# Patient Record
Sex: Male | Born: 1955
Health system: Southern US, Community
[De-identification: ages and names within clinical notes are randomized; demographics above are authoritative.]

## PROBLEM LIST (undated history)

## (undated) DIAGNOSIS — E785 Hyperlipidemia, unspecified: Secondary | ICD-10-CM

## (undated) DIAGNOSIS — K635 Polyp of colon: Secondary | ICD-10-CM

## (undated) DIAGNOSIS — I10 Essential (primary) hypertension: Secondary | ICD-10-CM

## (undated) DIAGNOSIS — E119 Type 2 diabetes mellitus without complications: Secondary | ICD-10-CM

## (undated) DIAGNOSIS — G4730 Sleep apnea, unspecified: Secondary | ICD-10-CM

## (undated) DIAGNOSIS — C801 Malignant (primary) neoplasm, unspecified: Secondary | ICD-10-CM

## (undated) DIAGNOSIS — F419 Anxiety disorder, unspecified: Secondary | ICD-10-CM

## (undated) DIAGNOSIS — F329 Major depressive disorder, single episode, unspecified: Secondary | ICD-10-CM

## (undated) DIAGNOSIS — K219 Gastro-esophageal reflux disease without esophagitis: Secondary | ICD-10-CM

## (undated) DIAGNOSIS — R32 Unspecified urinary incontinence: Secondary | ICD-10-CM

## (undated) DIAGNOSIS — F32A Depression, unspecified: Secondary | ICD-10-CM

## (undated) HISTORY — DX: Gastro-esophageal reflux disease without esophagitis: K21.9

## (undated) HISTORY — DX: Polyp of colon: K63.5

## (undated) HISTORY — DX: Major depressive disorder, single episode, unspecified: F32.9

## (undated) HISTORY — DX: Hyperlipidemia, unspecified: E78.5

## (undated) HISTORY — DX: Anxiety disorder, unspecified: F41.9

## (undated) HISTORY — DX: Type 2 diabetes mellitus without complications: E11.9

## (undated) HISTORY — DX: Depression, unspecified: F32.A

## (undated) HISTORY — DX: Essential (primary) hypertension: I10

## (undated) HISTORY — DX: Malignant (primary) neoplasm, unspecified: C80.1

## (undated) HISTORY — PX: COLONOSCOPY: SHX174

## (undated) HISTORY — DX: Sleep apnea, unspecified: G47.30

## (undated) HISTORY — DX: Unspecified urinary incontinence: R32

---

## 2010-09-25 HISTORY — PX: APPENDECTOMY: SHX54

## 2010-09-25 HISTORY — PX: HEMICOLECTOMY: SHX854

## 2015-09-26 HISTORY — PX: PROSTATECTOMY: SHX69

## 2018-07-03 ENCOUNTER — Encounter: Payer: Self-pay | Admitting: Family Medicine

## 2018-07-03 ENCOUNTER — Ambulatory Visit: Payer: Self-pay | Admitting: Family Medicine

## 2018-07-03 VITALS — BP 122/86 | HR 85 | Temp 98.2°F | Ht 73.0 in | Wt 231.4 lb

## 2018-07-03 DIAGNOSIS — Z1159 Encounter for screening for other viral diseases: Secondary | ICD-10-CM

## 2018-07-03 DIAGNOSIS — Z8546 Personal history of malignant neoplasm of prostate: Secondary | ICD-10-CM

## 2018-07-03 DIAGNOSIS — Z23 Encounter for immunization: Secondary | ICD-10-CM | POA: Diagnosis not present

## 2018-07-03 DIAGNOSIS — I10 Essential (primary) hypertension: Secondary | ICD-10-CM | POA: Diagnosis not present

## 2018-07-03 DIAGNOSIS — E785 Hyperlipidemia, unspecified: Secondary | ICD-10-CM | POA: Insufficient documentation

## 2018-07-03 DIAGNOSIS — Z9989 Dependence on other enabling machines and devices: Secondary | ICD-10-CM

## 2018-07-03 DIAGNOSIS — R5383 Other fatigue: Secondary | ICD-10-CM

## 2018-07-03 DIAGNOSIS — Z0000 Encounter for general adult medical examination without abnormal findings: Secondary | ICD-10-CM | POA: Diagnosis not present

## 2018-07-03 DIAGNOSIS — Z1211 Encounter for screening for malignant neoplasm of colon: Secondary | ICD-10-CM

## 2018-07-03 DIAGNOSIS — I2510 Atherosclerotic heart disease of native coronary artery without angina pectoris: Secondary | ICD-10-CM | POA: Insufficient documentation

## 2018-07-03 DIAGNOSIS — E782 Mixed hyperlipidemia: Secondary | ICD-10-CM

## 2018-07-03 DIAGNOSIS — F339 Major depressive disorder, recurrent, unspecified: Secondary | ICD-10-CM

## 2018-07-03 DIAGNOSIS — R32 Unspecified urinary incontinence: Secondary | ICD-10-CM | POA: Insufficient documentation

## 2018-07-03 DIAGNOSIS — N393 Stress incontinence (female) (male): Secondary | ICD-10-CM

## 2018-07-03 DIAGNOSIS — E119 Type 2 diabetes mellitus without complications: Secondary | ICD-10-CM

## 2018-07-03 DIAGNOSIS — G4733 Obstructive sleep apnea (adult) (pediatric): Secondary | ICD-10-CM

## 2018-07-03 LAB — MICROALBUMIN / CREATININE URINE RATIO
Creatinine,U: 201.8 mg/dL
MICROALB UR: 1.9 mg/dL (ref 0.0–1.9)
Microalb Creat Ratio: 0.9 mg/g (ref 0.0–30.0)

## 2018-07-03 LAB — CBC WITH DIFFERENTIAL/PLATELET
BASOS PCT: 0.6 % (ref 0.0–3.0)
Basophils Absolute: 0 10*3/uL (ref 0.0–0.1)
EOS PCT: 2 % (ref 0.0–5.0)
Eosinophils Absolute: 0.1 10*3/uL (ref 0.0–0.7)
HCT: 39.2 % (ref 39.0–52.0)
HEMOGLOBIN: 13.1 g/dL (ref 13.0–17.0)
Lymphocytes Relative: 18.3 % (ref 12.0–46.0)
Lymphs Abs: 1 10*3/uL (ref 0.7–4.0)
MCHC: 33.5 g/dL (ref 30.0–36.0)
MCV: 86.7 fl (ref 78.0–100.0)
MONO ABS: 0.3 10*3/uL (ref 0.1–1.0)
Monocytes Relative: 6 % (ref 3.0–12.0)
Neutro Abs: 3.9 10*3/uL (ref 1.4–7.7)
Neutrophils Relative %: 73.1 % (ref 43.0–77.0)
Platelets: 368 10*3/uL (ref 150.0–400.0)
RBC: 4.52 Mil/uL (ref 4.22–5.81)
RDW: 13.5 % (ref 11.5–15.5)
WBC: 5.3 10*3/uL (ref 4.0–10.5)

## 2018-07-03 LAB — LIPID PANEL
CHOLESTEROL: 148 mg/dL (ref 0–200)
HDL: 42 mg/dL (ref >39.00)
NonHDL: 105.67
Total CHOL/HDL Ratio: 4
Triglycerides: 262 mg/dL — ABNORMAL HIGH (ref 0.0–149.0)
VLDL: 52.4 mg/dL — ABNORMAL HIGH (ref 0.0–40.0)

## 2018-07-03 LAB — COMPREHENSIVE METABOLIC PANEL
ALBUMIN: 4.4 g/dL (ref 3.5–5.2)
ALK PHOS: 95 U/L (ref 39–117)
ALT: 15 U/L (ref 0–53)
AST: 15 U/L (ref 0–37)
BUN: 15 mg/dL (ref 6–23)
CHLORIDE: 98 meq/L (ref 96–112)
CO2: 34 mEq/L — ABNORMAL HIGH (ref 19–32)
Calcium: 9.9 mg/dL (ref 8.4–10.5)
Creatinine, Ser: 1.03 mg/dL (ref 0.40–1.50)
GFR: 77.45 mL/min (ref >60.00)
Glucose, Bld: 133 mg/dL — ABNORMAL HIGH (ref 70–99)
POTASSIUM: 4.2 meq/L (ref 3.5–5.1)
SODIUM: 138 meq/L (ref 135–145)
TOTAL PROTEIN: 7.4 g/dL (ref 6.0–8.3)
Total Bilirubin: 0.6 mg/dL (ref 0.2–1.2)

## 2018-07-03 LAB — TSH: TSH: 1.45 u[IU]/mL (ref 0.35–4.50)

## 2018-07-03 LAB — HEMOGLOBIN A1C: HEMOGLOBIN A1C: 6.7 % — AB (ref 4.6–6.5)

## 2018-07-03 LAB — LDL CHOLESTEROL, DIRECT: Direct LDL: 74 mg/dL

## 2018-07-03 LAB — VITAMIN D 25 HYDROXY (VIT D DEFICIENCY, FRACTURES): VITD: 31.3 ng/mL (ref 30.00–100.00)

## 2018-07-03 MED ORDER — NEFAZODONE HCL 100 MG PO TABS: 100.0000 mg | ORAL_TABLET | Freq: Two times a day (BID) | ORAL | 1 refills | Status: DC

## 2018-07-03 MED ORDER — CARVEDILOL 3.125 MG PO TABS: 3.1250 mg | ORAL_TABLET | Freq: Two times a day (BID) | ORAL | 3 refills | Status: DC

## 2018-07-03 MED ORDER — LISINOPRIL-HYDROCHLOROTHIAZIDE 20-12.5 MG PO TABS: 2.0000 | ORAL_TABLET | Freq: Every day | ORAL | 3 refills | Status: DC

## 2018-07-03 MED ORDER — OXYBUTYNIN CHLORIDE ER 5 MG PO TB24: 5.0000 mg | ORAL_TABLET | Freq: Every day | ORAL | 1 refills | Status: DC

## 2018-07-03 MED ORDER — ATORVASTATIN CALCIUM 80 MG PO TABS: 80.0000 mg | ORAL_TABLET | Freq: Every day | ORAL | 3 refills | Status: DC

## 2018-07-03 MED ORDER — METFORMIN HCL ER 500 MG PO TB24: 1000.0000 mg | ORAL_TABLET | Freq: Two times a day (BID) | ORAL | 1 refills | Status: DC

## 2018-07-03 MED ORDER — BUPROPION HCL ER (SR) 200 MG PO TB12: 200.0000 mg | ORAL_TABLET | Freq: Two times a day (BID) | ORAL | 1 refills | Status: DC

## 2018-07-03 MED ORDER — OMEPRAZOLE 20 MG PO CPDR: 20.0000 mg | DELAYED_RELEASE_CAPSULE | Freq: Every day | ORAL | 3 refills | Status: DC

## 2018-07-03 NOTE — Progress Notes (Signed)
Patient: Theodore Foley MRN: [id number] DOB: [birth year] PCP: No primary care provider on file.     Subjective:  Chief Complaint  Patient presents with  . Establish Care    HPI: The patient is a 62 y.o. male who presents today for annual exam. He denies any changes to past medical history. There have been no recent hospitalizations. They are following a well balanced diet and exercise plan. Weight has been stable. No complaints today.   Hypertension: Here for follow up of hypertension.  Currently on lisinopril-hctz and coreg . Takes medication as prescribed and denies any side effects. Exercise includes walking. Weight has been stable. Denies any chest pain, headaches, shortness of breath, vision changes, swelling in lower extremities.   ?CAD-saw [facility] for chest pain. On asa/ng/coreg. nuc stress in 01/2018 was negative.   Diabetes: Patient is here for follow up of type 2 diabete.  Currently on the following medications farxiga, metformin and amaryl. Takes medications as prescribed. Last A1C was 6.6 in July. Currently exercising and following diabetic diet. Denies any hypoglycemic events. Denies any vision changes, nausea, vomiting, abdominal pain, ulcers/paraesthesia in feet, polyuria, polydipsia or polyphagia. Denies any chest pain, shortness of breath. Wants to stop his farxiga due to cost.   Depression:  Was diagnosed on 1992. Has been on current medication for a while and states it works great for him. Has been on medication for this time and been on everything until they found this combination and it works well for him. No hx of si/hi/ah/vh. No struggles with anxiety.   Urinary incontinence: states after his prostate removal has struggled with incontinence. Is currently on myrbetriq, but can not afford this and would like a cheaper alternative.   Immunization History  Administered Date(s) Administered  . Influenza Split 06/20/2010  . Influenza, Seasonal, Injecte, Preservative Fre  06/23/2011, 06/21/2012  . Influenza,inj,Quad PF,6+ Mos 07/03/2018  . Influenza-Unspecified 05/25/2017  . Tdap 06/23/2011, 07/03/2018     Colonoscopy: 2013. Hx of adenomas. 2012 had hemi-colectomy. Over due for next scope.  PSA: hx of prostate cancer with removal in 05/2016. Needs referral for urology here.  Tdap: today  Flu: today  Pneumovax: has had this done. 04/2017  Review of Systems  Constitutional: Positive for fatigue. Negative for chills and fever.  HENT: Negative for dental problem, ear pain, hearing loss and trouble swallowing.   Eyes: Negative for visual disturbance.  Respiratory: Negative for cough, chest tightness and shortness of breath.   Cardiovascular: Negative for chest pain, palpitations and leg swelling.  Gastrointestinal: Positive for nausea. Negative for abdominal pain, blood in stool and diarrhea.       Gi upset w/Metformin  Endocrine: Negative for cold intolerance, polydipsia, polyphagia and polyuria.  Genitourinary: Negative for dysuria and hematuria.  Musculoskeletal: Positive for back pain. Negative for arthralgias and neck pain.       Upper back, left shoulder stiffness  Skin: Negative.  Negative for rash.  Neurological: Negative for dizziness and headaches.  Psychiatric/Behavioral: Positive for sleep disturbance. Negative for dysphoric mood. The patient is not nervous/anxious.     Allergies Patient has no allergies on file.  Past Medical History Patient  has a past medical history of Cancer [facility]), Colon polyps, Depression, Diabetes mellitus without complication ([place]), GERD (gastroesophageal reflux disease), Hyperlipidemia, Hypertension, and Urine incontinence.  Surgical History Patient  has a past surgical history that includes Prostatectomy (2017); Hemicolectomy (Right, 2012); and Appendectomy (2012).  Family History Pateint's family history includes Alcohol abuse in his sister; Cancer in  his brother and maternal grandfather; Depression in his  brother, daughter, daughter, daughter, father, paternal grandmother, son, and son; Diabetes in his brother; Early death in his brother; Hearing loss in his brother; Heart attack in his brother, mother, and paternal grandfather; Heart disease in his brother, maternal grandmother, mother, and paternal grandfather; Hyperlipidemia in his brother and father; Hypertension in his brother, brother, father, and maternal grandmother; Miscarriages / [place] in his mother.  Social History Patient  reports that he has quit smoking. He has never used smokeless tobacco.    Objective: Vitals:   07/03/18 1021  BP: 122/86  Pulse: 85  Temp: 98.2 F (36.8 C)  TempSrc: Oral  SpO2: 97%  Weight: 231 lb 6.4 oz (105 kg)  Height: 6\' 1"  (1.854 m)    Body mass index is 30.53 kg/m.  Physical Exam  Constitutional: He is oriented to person, place, and time. He appears well-developed and well-nourished.  HENT:  Right Ear: External ear normal.  Left Ear: External ear normal.  Mouth/Throat: Oropharynx is clear and moist. No oropharyngeal exudate.  Hearing aides bilaterally. TM pearly with light reflex on left and cerumen impacted on right.   Eyes: Pupils are equal, round, and reactive to light. Conjunctivae and EOM are normal.  Neck: Normal range of motion. Neck supple. No thyromegaly present.  Cardiovascular: Normal rate, regular rhythm, normal heart sounds and intact distal pulses.  No murmur heard. Pulmonary/Chest: Effort normal and breath sounds normal.  Abdominal: Soft. Bowel sounds are normal. He exhibits no distension. There is no tenderness.  Lymphadenopathy:    He has no cervical adenopathy.  Neurological: He is alert and oriented to person, place, and time. He displays normal reflexes. No cranial nerve deficit. Coordination normal.  Skin: Skin is warm and dry. No rash noted.  Psychiatric: He has a normal mood and affect. His behavior is normal.  Vitals reviewed.        Office Visit from  07/03/2018 in [place]  PHQ-9 Total Score  9      Assessment/plan: 1. Annual physical exam Routine lab work today. Overdue for cscope which he needs q 3-5 years with his history. Referral done for this. Tdap/flu shot today. utd on pneumovax per patient, but requesting records to make sure as I do not see in care everywhere.  Exercise/diabetic diet recommended. F/u in one year for routine annual.  Patient counseling [x]    Nutrition: Stressed importance of moderation in sodium/caffeine intake, saturated fat and cholesterol, caloric balance, sufficient intake of fresh fruits, vegetables, fiber, calcium, iron, and 1 mg of folate supplement per day (for females capable of pregnancy).  [x]    Stressed the importance of regular exercise.   []    Substance Abuse: Discussed cessation/primary prevention of tobacco, alcohol, or other drug use; driving or other dangerous activities under the influence; availability of treatment for abuse.   [x]    Injury prevention: Discussed safety belts, safety helmets, smoke detector, smoking near bedding or upholstery.   [x]    Sexuality: Discussed sexually transmitted diseases, partner selection, use of condoms, avoidance of unintended pregnancy  and contraceptive alternatives.  [x]    Dental health: Discussed importance of regular tooth brushing, flossing, and dental visits.  [x]    Health maintenance and immunizations reviewed. Please refer to Health maintenance section.    - TSH  2. Need for prophylactic vaccination with combined diphtheria-tetanus-pertussis (DTP) vaccine  - Tdap vaccine greater than or equal to 7yo IM  3. Essential hypertension Blood pressure is to goal.  Continue current anti-hypertensive medications. Refills given and routine lab work will be done today. Recommended routine exercise and healthy diet including DASH diet and mediterranean diet. Encouraged weight loss. F/u in 6 months.   - Comprehensive metabolic panel -  CBC with Differential/Platelet - Lipid panel  4. Mixed hyperlipidemia -rechecking today after cardiology increased his stating to 80mg . Also checking liver fx. He is fastin today.   5. Diabetes mellitus without complication (HCC) Last T9Q was to goal at 6.6. He can no longer afford farxiga and likely makes his incontinence worse. Checking a1c today. Refilled his metformin. im not a fan of amaryl and asked if he would be willing to try weekly injectable. May have him check with insurance and see if any of the other sglt2 drugs are covered by his insurance, otherwise I think I will stop the amaryl and see how he does on just metformin and lower dose of the GLP-1 drugs. Continue diabetic diet. Discussed yearly eye exams. Will do leep exam next visit. Looking into records to see if up to date on his pneumovax. F/u in 3-6 months depending on lab work. Regardless will need labs in 3 months with med changes. Hypoglycemic precautions given.  - Hemoglobin A1c - Microalbumin / creatinine urine ratio  6. Encounter for screening colonoscopy  - Ambulatory referral to Gastroenterology  7. Encounter for hepatitis C screening test for low risk patient  - Hepatitis C antibody  8. History of prostate cancer -needs referral to see urologist here. Will have them draw the psa. He will talk over with daughter and then let me know who they would like to be referred to.   9. Depression, recurrent ([place]) Well controlled on his current medication and phq-9 score indicative of this with mild depression. Refilled both of his drugs. F/u in 6-12 months.   10. Stress incontinence of urine Can not afford the myrbetriq. We will stop this and do trial on antimuscarinic. Discussed these drugs have a lot more side effects and evidence has showed that possible link to increased dementia. Falls, dizziness, dry eyes, etc. Also may increase effects of ssri. Discussed he can follow up with urology on new drug and they can manage.  Let me know if any issues.    11. OSA on CPAP Non compliant. Discussed risks of untreated osa. Weight loss also recommended.   12. Need for prophylactic vaccination and inoculation against influenza  - Flu Vaccine QUAD 6+ mos PF IM (Fluarix Quad PF)  13. Other fatigue  - VITAMIN D 25 Hydroxy (Vit-D Deficiency, Fractures)  -will review records from care everywhere. >30 minutes spent in managing/treating multiple co-morbidities outside of annual exam.   Return in about 6 months (around 01/02/2019) for diabetic check up .     Orma Flaming, MD [place]  07/03/2018

## 2018-07-04 ENCOUNTER — Telehealth: Payer: Self-pay | Admitting: Family Medicine

## 2018-07-04 LAB — HEPATITIS C ANTIBODY
Hepatitis C Ab: NONREACTIVE
SIGNAL TO CUT-OFF: 0.03 (ref <1.00)

## 2018-07-04 NOTE — Telephone Encounter (Signed)
See note  Copied from [place] [id number]. Topic: General - Other >> Jul 04, 2018  3:16 PM Ivar Drape wrote: Reason for CRM:  Patient stated Dr. Rogers Blocker left a message on his voicemail to give her a call.  He returned the call, but the office could not be reached. Please call again.

## 2018-07-04 NOTE — Telephone Encounter (Signed)
Dr. Rogers Blocker spoke with patient and reviewed all lab results.  He verbalized understanding.

## 2018-08-05 ENCOUNTER — Encounter: Payer: Self-pay | Admitting: Family Medicine

## 2018-08-14 ENCOUNTER — Telehealth: Payer: Self-pay | Admitting: Gastroenterology

## 2018-09-24 ENCOUNTER — Telehealth: Payer: Self-pay | Admitting: Radiology

## 2018-09-24 NOTE — Telephone Encounter (Signed)
Please place future lab orders for patients lab appt on 10/03/2018. Thanks!

## 2018-10-02 ENCOUNTER — Other Ambulatory Visit: Payer: Self-pay

## 2018-10-02 DIAGNOSIS — E119 Type 2 diabetes mellitus without complications: Secondary | ICD-10-CM

## 2018-10-02 NOTE — Telephone Encounter (Signed)
Future lab orders placed for cmp and a1c.  Pt has lab appt on 1/9.

## 2018-10-03 ENCOUNTER — Other Ambulatory Visit (INDEPENDENT_AMBULATORY_CARE_PROVIDER_SITE_OTHER): Payer: BLUE CROSS/BLUE SHIELD

## 2018-10-03 DIAGNOSIS — E119 Type 2 diabetes mellitus without complications: Secondary | ICD-10-CM

## 2018-10-03 LAB — COMPREHENSIVE METABOLIC PANEL WITH GFR
ALT: 15 U/L (ref 0–53)
AST: 12 U/L (ref 0–37)
Albumin: 4.4 g/dL (ref 3.5–5.2)
Alkaline Phosphatase: 89 U/L (ref 39–117)
BUN: 19 mg/dL (ref 6–23)
CO2: 29 meq/L (ref 19–32)
Calcium: 9.8 mg/dL (ref 8.4–10.5)
Chloride: 97 meq/L (ref 96–112)
Creatinine, Ser: 1.22 mg/dL (ref 0.40–1.50)
GFR: 63.65 mL/min
Glucose, Bld: 178 mg/dL — ABNORMAL HIGH (ref 70–99)
Potassium: 4.7 meq/L (ref 3.5–5.1)
Sodium: 137 meq/L (ref 135–145)
Total Bilirubin: 0.4 mg/dL (ref 0.2–1.2)
Total Protein: 7.1 g/dL (ref 6.0–8.3)

## 2018-10-03 LAB — HEMOGLOBIN A1C: Hgb A1c MFr Bld: 7.4 % — ABNORMAL HIGH (ref 4.6–6.5)

## 2018-10-09 ENCOUNTER — Ambulatory Visit: Payer: BLUE CROSS/BLUE SHIELD | Admitting: Family Medicine

## 2018-10-09 ENCOUNTER — Encounter: Payer: Self-pay | Admitting: Family Medicine

## 2018-10-09 VITALS — BP 98/68 | HR 84 | Temp 97.7°F | Ht 73.0 in | Wt 233.4 lb

## 2018-10-09 DIAGNOSIS — E782 Mixed hyperlipidemia: Secondary | ICD-10-CM

## 2018-10-09 DIAGNOSIS — E119 Type 2 diabetes mellitus without complications: Secondary | ICD-10-CM | POA: Diagnosis not present

## 2018-10-09 DIAGNOSIS — I10 Essential (primary) hypertension: Secondary | ICD-10-CM

## 2018-10-09 DIAGNOSIS — Z114 Encounter for screening for human immunodeficiency virus [HIV]: Secondary | ICD-10-CM

## 2018-10-09 DIAGNOSIS — Z23 Encounter for immunization: Secondary | ICD-10-CM | POA: Diagnosis not present

## 2018-10-09 MED ORDER — OMEPRAZOLE 20 MG PO CPDR: 20.0000 mg | DELAYED_RELEASE_CAPSULE | Freq: Every day | ORAL | 3 refills | Status: DC

## 2018-10-09 MED ORDER — METFORMIN HCL ER 500 MG PO TB24: 1000.0000 mg | ORAL_TABLET | Freq: Two times a day (BID) | ORAL | 1 refills | Status: DC

## 2018-10-09 MED ORDER — BUPROPION HCL ER (SR) 200 MG PO TB12: 200.0000 mg | ORAL_TABLET | Freq: Two times a day (BID) | ORAL | 1 refills | Status: DC

## 2018-10-09 MED ORDER — ATORVASTATIN CALCIUM 80 MG PO TABS: 80.0000 mg | ORAL_TABLET | Freq: Every day | ORAL | 3 refills | Status: DC

## 2018-10-09 MED ORDER — OXYBUTYNIN CHLORIDE ER 5 MG PO TB24: 5.0000 mg | ORAL_TABLET | Freq: Every day | ORAL | 3 refills | Status: DC

## 2018-10-09 MED ORDER — NEFAZODONE HCL 100 MG PO TABS: 100.0000 mg | ORAL_TABLET | Freq: Two times a day (BID) | ORAL | 1 refills | Status: DC

## 2018-10-09 MED ORDER — EMPAGLIFLOZIN 25 MG PO TABS: 25.0000 mg | ORAL_TABLET | Freq: Every day | ORAL | 3 refills | Status: DC

## 2018-10-09 MED ORDER — LISINOPRIL-HYDROCHLOROTHIAZIDE 20-12.5 MG PO TABS: 2.0000 | ORAL_TABLET | Freq: Every day | ORAL | 3 refills | Status: DC

## 2018-10-09 NOTE — Patient Instructions (Addendum)
[facility].[place] Kitchen Dr. Fuller Plan was who was looking at your OEVOJ [phone]    For diabetes.. increasing your jardiance to 25mg  and you are going to work on diet. See how you do and check labs in 3 months. I ordered these for you. Will see if you are to goal and if so we can push appointment to 6 months.    Stopping coreg. Consult your cardiologist about taper off of this, but would drop down to once/day for a week or so and then 1/2 tablet or stop.

## 2018-10-09 NOTE — Progress Notes (Addendum)
Patient: Theodore Foley MRN: [id number] DOB: [birth year] PCP: Orma Flaming, MD     Subjective:  Chief Complaint  Patient presents with  . Diabetes    HPI: The patient is a 63 y.o. male who presents today for diabetes follow up. His A1c was just checked and it was 7.4. When I saw him 3 months ago we refilled his metformin, stopped his amaryl (did not like it and neither do I) and farxiga due to cost. We started him on jardiance and he is doing fine with this. He is only on the 10mg  dose. He also states it was the holiday and his diet was poor. Renal function was normal. He is otherwise well. Denies any hypoglycemic events. Denies any vision changes, nausea, vomiting, abdominal pain, ulcers/paraesthesia in feet, polyuria, polydipsia or polyphagia. Denies any chest pain, shortness of breath. He is not exercising like he should and really wants to get back into using his treadmill.   Colonoscopy: due for this and I referred him 3 months ago when I saw him. I got a note saying they were unable to get a hold of patient; however, there is a note that says Dr Fuller Plan received records and would review; however, patient has never heard back.   He is also wondering about stopping his beta blocker. Has no hx of CAD/CHF.   Review of Systems  Constitutional: Negative for fatigue.  Eyes: Negative for visual disturbance.  Respiratory: Negative for shortness of breath.   Cardiovascular: Negative for chest pain.  Gastrointestinal: Positive for abdominal pain and diarrhea. Negative for nausea.       C/o diarrhea and stomach cramping w/Metformin  Endocrine: Negative for polydipsia, polyphagia and polyuria.  Genitourinary: Negative for frequency and urgency.  Musculoskeletal: Positive for back pain. Negative for neck pain.       C/o chronic low back pain  Skin: Negative.   Neurological: Negative for dizziness and headaches.  Psychiatric/Behavioral: Positive for sleep disturbance. The patient is not  nervous/anxious.     Allergies Patient has no allergies on file.  Past Medical History Patient  has a past medical history of Cancer [facility]), Colon polyps, Depression, Diabetes mellitus without complication ([place]), GERD (gastroesophageal reflux disease), Hyperlipidemia, Hypertension, and Urine incontinence.  Surgical History Patient  has a past surgical history that includes Prostatectomy (2017); Hemicolectomy (Right, 2012); and Appendectomy (2012).  Family History Pateint's family history includes Alcohol abuse in his sister; Cancer in his brother and maternal grandfather; Depression in his brother, daughter, daughter, daughter, father, paternal grandmother, son, and son; Diabetes in his brother; Early death in his brother; Hearing loss in his brother; Heart attack in his brother, mother, and paternal grandfather; Heart disease in his brother, maternal grandmother, mother, and paternal grandfather; Hyperlipidemia in his brother and father; Hypertension in his brother, brother, father, and maternal grandmother; Miscarriages / [place] in his mother.  Social History Patient  reports that he has quit smoking. He has never used smokeless tobacco.    Objective: Vitals:   10/09/18 0942  BP: 98/68  Pulse: 84  Temp: 97.7 F (36.5 C)  TempSrc: Oral  SpO2: 96%  Weight: 233 lb 6.4 oz (105.9 kg)  Height: 6\' 1"  (1.854 m)    Body mass index is 30.79 kg/m.  Physical Exam Vitals signs reviewed.  Constitutional:      Appearance: He is obese.  HENT:     Right Ear: Tympanic membrane, ear canal and external ear normal.     Left Ear: Tympanic membrane, ear canal  and external ear normal.     Nose: Nose normal.  Neck:     Musculoskeletal: Normal range of motion and neck supple.  Cardiovascular:     Rate and Rhythm: Normal rate and regular rhythm.     Heart sounds: Normal heart sounds.  Pulmonary:     Effort: Pulmonary effort is normal.     Breath sounds: Normal breath sounds.  Abdominal:      General: Abdomen is flat. Bowel sounds are normal.     Palpations: Abdomen is soft.  Neurological:     General: No focal deficit present.     Mental Status: He is alert and oriented to person, place, and time.    Diabetic foot exam done today and normal. See chart.      Assessment/plan: 1. Need for shingles vaccine  - Varicella-zoster vaccine IM (Shingrix)  2. Diabetes mellitus without complication ([place]) Above goal with a1c of 7.4 he brought his drug list and we could do ozempic. He does want to try and increase his jardiance and really work on diet and exercise. i'll give him 3 months of diet/exercise, continue metformin and increase his jardiance to 25mg . If not to goal in 3 months we will add on ozempic. He is agreeable to this plan as he would like to stay off any injectable if he can.  - Hemoglobin A1c; Future  3. Essential hypertension Will be due for labs in 3 months, future ordered.  - CBC with Differential/Platelet; Future - Comprehensive metabolic panel; Future  4. Mixed hyperlipidemia  - Lipid panel; Future  5. Encounter for screening for HIV - HIV Antibody (routine testing w rflx); Future  -number given for [facility] to see if he needs to schedule after Dr. Fuller Plan reviewed records.   -will stop beta blocker. No indication for this as he has no heart disease/chf and his blood pressure is to goal. Wean discussed. Daughter is cardiologist as well.   Return in about 3 months (around 01/08/2019) for labs only and will see if need to be seen .   Orma Flaming, MD [place]   10/09/2018

## 2018-10-10 ENCOUNTER — Encounter: Payer: Self-pay | Admitting: Family Medicine

## 2018-10-10 ENCOUNTER — Other Ambulatory Visit: Payer: Self-pay | Admitting: Family Medicine

## 2018-10-10 ENCOUNTER — Encounter: Payer: Self-pay | Admitting: Gastroenterology

## 2018-10-10 DIAGNOSIS — Z8546 Personal history of malignant neoplasm of prostate: Secondary | ICD-10-CM

## 2018-10-30 ENCOUNTER — Ambulatory Visit (AMBULATORY_SURGERY_CENTER): Payer: Self-pay | Admitting: *Deleted

## 2018-10-30 ENCOUNTER — Other Ambulatory Visit: Payer: Self-pay

## 2018-10-30 VITALS — Ht 73.0 in | Wt 232.4 lb

## 2018-10-30 DIAGNOSIS — Z86010 Personal history of colonic polyps: Secondary | ICD-10-CM

## 2018-10-30 MED ORDER — SUPREP BOWEL PREP KIT 17.5-3.13-1.6 GM/177ML PO SOLN: 1.0000 | Freq: Once | ORAL | 0 refills | Status: AC

## 2018-10-30 NOTE — Progress Notes (Signed)
No egg or soy allergy known to patient  No issues with past sedation with any surgeries  or procedures, no intubation problems  No diet pills per patient No home 02 use per patient  No blood thinners per patient  Pt denies issues with constipation  No A fib or A flutter  EMMI video sent to pt's e mail  suprep 15.00 coupon given 

## 2018-11-06 ENCOUNTER — Encounter: Payer: Self-pay | Admitting: Gastroenterology

## 2018-11-20 ENCOUNTER — Encounter: Payer: Self-pay | Admitting: Gastroenterology

## 2018-11-20 ENCOUNTER — Ambulatory Visit (AMBULATORY_SURGERY_CENTER): Payer: BLUE CROSS/BLUE SHIELD | Admitting: Gastroenterology

## 2018-11-20 VITALS — BP 99/57 | HR 76 | Temp 96.8°F | Resp 17 | Ht 73.0 in | Wt 233.0 lb

## 2018-11-20 DIAGNOSIS — Z86010 Personal history of colonic polyps: Secondary | ICD-10-CM

## 2018-11-20 DIAGNOSIS — Z1211 Encounter for screening for malignant neoplasm of colon: Secondary | ICD-10-CM | POA: Diagnosis not present

## 2018-11-20 DIAGNOSIS — D123 Benign neoplasm of transverse colon: Secondary | ICD-10-CM

## 2018-11-20 DIAGNOSIS — D125 Benign neoplasm of sigmoid colon: Secondary | ICD-10-CM | POA: Diagnosis not present

## 2018-11-20 MED ORDER — SODIUM CHLORIDE 0.9 % IV SOLN: 500.0000 mL | Freq: Once | INTRAVENOUS | Status: DC

## 2018-11-20 NOTE — Op Note (Signed)
[facility] Patient Name: Theodore Foley Procedure Date: 11/20/2018 11:01 AM MRN: [id number] Endoscopist: Ladene Artist , MD Age: 63 Referring MD:  Date of Birth: [birth year] Gender: Male Account #: [id number] Procedure:                Colonoscopy Indications:              Surveillance: Personal history of adenomatous                            polyps on last colonoscopy > 5 years ago Medicines:                Monitored Anesthesia Care Procedure:                Pre-Anesthesia Assessment:                           - Prior to the procedure, a History and Physical                            was performed, and patient medications and                            allergies were reviewed. The patient's tolerance of                            previous anesthesia was also reviewed. The risks                            and benefits of the procedure and the sedation                            options and risks were discussed with the patient.                            All questions were answered, and informed consent                            was obtained. Prior Anticoagulants: The patient has                            taken no previous anticoagulant or antiplatelet                            agents. ASA Grade Assessment: II - A patient with                            mild systemic disease. After reviewing the risks                            and benefits, the patient was deemed in                            satisfactory condition to undergo the procedure.  After obtaining informed consent, the colonoscope                            was passed under direct vision. Throughout the                            procedure, the patient's blood pressure, pulse, and                            oxygen saturations were monitored continuously. The                            Colonoscope was introduced through the anus and                            advanced to the the  ileocolonic anastomosis. The                            quality of the bowel preparation was adequate after                            lavage and suctioning. The colonoscopy was                            performed without difficulty. The patient tolerated                            the procedure well. The terminal ileum and the                            rectum were photographed. Scope In: 11:12:23 AM Scope Out: 11:29:30 AM Scope Withdrawal Time: 0 hours 14 minutes 47 seconds  Total Procedure Duration: 0 hours 17 minutes 7 seconds  Findings:                 The perianal and digital rectal examinations were                            normal.                           There was evidence of a prior end-to-side                            ileo-colonic anastomosis in the ascending colon.                            This was patent and was characterized by healthy                            appearing mucosa. The anastomosis was traversed.                           Two sessile polyps were found in the sigmoid colon  and transverse colon. The polyps were 7 to 9 mm in                            size. These polyps were removed with a cold snare.                            Resection and retrieval were complete.                           Multiple medium-mouthed diverticula were found in                            the left colon. There was no evidence of                            diverticular bleeding.                           Internal hemorrhoids were found during                            retroflexion. The hemorrhoids were medium-sized and                            Grade I (internal hemorrhoids that do not prolapse).                           The exam was otherwise without abnormality on                            direct and retroflexion views. Complications:            No immediate complications. Estimated blood loss:                            None. Estimated Blood  Loss:     Estimated blood loss: none. Impression:               - Patent end-to-side ileo-colonic anastomosis,                            characterized by healthy appearing mucosa.                           - Two 7 to 9 mm polyps in the sigmoid colon and in                            the transverse colon, removed with a cold snare.                            Resected and retrieved.                           - Internal hemorrhoids.                           -  Moderate diverticulosis in the left colon. There                            was no evidence of diverticular bleeding. Recommendation:           - Repeat colonoscopy in 5 years for surveillance.                           - Patient has a contact number available for                            emergencies. The signs and symptoms of potential                            delayed complications were discussed with the                            patient. Return to normal activities tomorrow.                            Written discharge instructions were provided to the                            patient.                           - High fiber diet.                           - Continue present medications.                           - Await pathology results. Ladene Artist, MD 11/20/2018 11:36:09 AM This report has been signed electronically.

## 2018-11-20 NOTE — Patient Instructions (Signed)
Handout on high fiber diet, hemorrhoids, polyps and diverticulosis given   YOU HAD AN ENDOSCOPIC PROCEDURE TODAY AT [facility]:   Refer to the procedure report that was given to you for any specific questions about what was found during the examination.  If the procedure report does not answer your questions, please call your gastroenterologist to clarify.  If you requested that your care partner not be given the details of your procedure findings, then the procedure report has been included in a sealed envelope for you to review at your convenience later.  YOU SHOULD EXPECT: Some feelings of bloating in the abdomen. Passage of more gas than usual.  Walking can help get rid of the air that was put into your GI tract during the procedure and reduce the bloating. If you had a lower endoscopy (such as a colonoscopy or flexible sigmoidoscopy) you may notice spotting of blood in your stool or on the toilet paper. If you underwent a bowel prep for your procedure, you may not have a normal bowel movement for a few days.  Please Note:  You might notice some irritation and congestion in your nose or some drainage.  This is from the oxygen used during your procedure.  There is no need for concern and it should clear up in a day or so.  SYMPTOMS TO REPORT IMMEDIATELY:   Following lower endoscopy (colonoscopy or flexible sigmoidoscopy):  Excessive amounts of blood in the stool  Significant tenderness or worsening of abdominal pains  Swelling of the abdomen that is new, acute  Fever of 100F or higher  For urgent or emergent issues, a gastroenterologist can be reached at any hour by calling [phone].   DIET:  We do recommend a small meal at first, but then you may proceed to your regular diet.  Drink plenty of fluids but you should avoid alcoholic beverages for 24 hours.  ACTIVITY:  You should plan to take it easy for the rest of today and you should NOT DRIVE or use heavy machinery  until tomorrow (because of the sedation medicines used during the test).    FOLLOW UP: Our staff will call the number listed on your records the next business day following your procedure to check on you and address any questions or concerns that you may have regarding the information given to you following your procedure. If we do not reach you, we will leave a message.  However, if you are feeling well and you are not experiencing any problems, there is no need to return our call.  We will assume that you have returned to your regular daily activities without incident.  If any biopsies were taken you will be contacted by phone or by letter within the next 1-3 weeks.  Please call us at [phone] if you have not heard about the biopsies in 3 weeks.    SIGNATURES/CONFIDENTIALITY: You and/or your care partner have signed paperwork which will be entered into your electronic medical record.  These signatures attest to the fact that that the information above on your After Visit Summary has been reviewed and is understood.  Full responsibility of the confidentiality of this discharge information lies with you and/or your care-partner.

## 2018-11-20 NOTE — Progress Notes (Signed)
Pt's states no medical or surgical changes since previsit or office visit. 

## 2018-11-20 NOTE — Progress Notes (Signed)
To PACU, VSS. Report to [email or web address] 

## 2018-11-21 ENCOUNTER — Telehealth: Payer: Self-pay | Admitting: *Deleted

## 2018-11-21 NOTE — Telephone Encounter (Signed)
  Follow up Call-  Call back number 11/20/2018  Post procedure Call Back phone  # [phone]  Permission to leave phone message Yes  Some recent data might be hidden     Patient questions:  Do you have a fever, pain , or abdominal swelling? No. Pain Score  0 *  Have you tolerated food without any problems? Yes.    Have you been able to return to your normal activities? Yes.    Do you have any questions about your discharge instructions: Diet   No. Medications  No. Follow up visit  No.  Do you have questions or concerns about your Care? No.  Actions: * If pain score is 4 or above: No action needed, pain <4.

## 2018-11-28 ENCOUNTER — Encounter: Payer: Self-pay | Admitting: Gastroenterology

## 2018-12-13 ENCOUNTER — Other Ambulatory Visit: Payer: Self-pay | Admitting: Family Medicine

## 2018-12-13 MED ORDER — OSELTAMIVIR PHOSPHATE 75 MG PO CAPS: 75.0000 mg | ORAL_CAPSULE | Freq: Every day | ORAL | 0 refills | Status: DC

## 2018-12-13 NOTE — Progress Notes (Signed)
Grandson diagnosed with the flu. Symptom onset less than 24 hours. Lives with him. Requesting prophylaxis tamiflu. Sent in for him. No renal adjustment.   Orma Flaming, MD [place]

## 2018-12-26 NOTE — Telephone Encounter (Signed)
Pt has seen Dr. Fuller Plan

## 2019-01-02 ENCOUNTER — Ambulatory Visit: Payer: BLUE CROSS/BLUE SHIELD | Admitting: Family Medicine

## 2019-01-08 ENCOUNTER — Other Ambulatory Visit: Payer: Self-pay

## 2019-01-08 ENCOUNTER — Other Ambulatory Visit (INDEPENDENT_AMBULATORY_CARE_PROVIDER_SITE_OTHER): Payer: BLUE CROSS/BLUE SHIELD

## 2019-01-08 ENCOUNTER — Ambulatory Visit (INDEPENDENT_AMBULATORY_CARE_PROVIDER_SITE_OTHER): Payer: BLUE CROSS/BLUE SHIELD

## 2019-01-08 DIAGNOSIS — I10 Essential (primary) hypertension: Secondary | ICD-10-CM | POA: Diagnosis not present

## 2019-01-08 DIAGNOSIS — E119 Type 2 diabetes mellitus without complications: Secondary | ICD-10-CM

## 2019-01-08 DIAGNOSIS — E782 Mixed hyperlipidemia: Secondary | ICD-10-CM

## 2019-01-08 DIAGNOSIS — Z23 Encounter for immunization: Secondary | ICD-10-CM | POA: Diagnosis not present

## 2019-01-08 DIAGNOSIS — Z8546 Personal history of malignant neoplasm of prostate: Secondary | ICD-10-CM | POA: Diagnosis not present

## 2019-01-08 DIAGNOSIS — Z114 Encounter for screening for human immunodeficiency virus [HIV]: Secondary | ICD-10-CM | POA: Diagnosis not present

## 2019-01-08 LAB — CBC WITH DIFFERENTIAL/PLATELET
Basophils Absolute: 0.1 10*3/uL (ref 0.0–0.1)
Basophils Relative: 0.8 % (ref 0.0–3.0)
Eosinophils Absolute: 0.1 10*3/uL (ref 0.0–0.7)
Eosinophils Relative: 1.7 % (ref 0.0–5.0)
HCT: 39 % (ref 39.0–52.0)
Hemoglobin: 13.3 g/dL (ref 13.0–17.0)
Lymphocytes Relative: 14.5 % (ref 12.0–46.0)
Lymphs Abs: 0.9 10*3/uL (ref 0.7–4.0)
MCHC: 34.2 g/dL (ref 30.0–36.0)
MCV: 86.7 fl (ref 78.0–100.0)
Monocytes Absolute: 0.3 10*3/uL (ref 0.1–1.0)
Monocytes Relative: 5.2 % (ref 3.0–12.0)
Neutro Abs: 4.9 10*3/uL (ref 1.4–7.7)
Neutrophils Relative %: 77.8 % — ABNORMAL HIGH (ref 43.0–77.0)
Platelets: 308 10*3/uL (ref 150.0–400.0)
RBC: 4.49 Mil/uL (ref 4.22–5.81)
RDW: 13.6 % (ref 11.5–15.5)
WBC: 6.2 10*3/uL (ref 4.0–10.5)

## 2019-01-08 LAB — LIPID PANEL
Cholesterol: 106 mg/dL (ref 0–200)
HDL: 34.5 mg/dL — ABNORMAL LOW (ref >39.00)
NonHDL: 71.05
Total CHOL/HDL Ratio: 3
Triglycerides: 225 mg/dL — ABNORMAL HIGH (ref 0.0–149.0)
VLDL: 45 mg/dL — ABNORMAL HIGH (ref 0.0–40.0)

## 2019-01-08 LAB — COMPREHENSIVE METABOLIC PANEL
ALT: 14 U/L (ref 0–53)
AST: 13 U/L (ref 0–37)
Albumin: 4.5 g/dL (ref 3.5–5.2)
Alkaline Phosphatase: 88 U/L (ref 39–117)
BUN: 29 mg/dL — ABNORMAL HIGH (ref 6–23)
CO2: 28 mEq/L (ref 19–32)
Calcium: 9.5 mg/dL (ref 8.4–10.5)
Chloride: 96 mEq/L (ref 96–112)
Creatinine, Ser: 1.41 mg/dL (ref 0.40–1.50)
GFR: 50.63 mL/min — ABNORMAL LOW (ref >60.00)
Glucose, Bld: 125 mg/dL — ABNORMAL HIGH (ref 70–99)
Potassium: 4.4 mEq/L (ref 3.5–5.1)
Sodium: 134 mEq/L — ABNORMAL LOW (ref 135–145)
Total Bilirubin: 0.6 mg/dL (ref 0.2–1.2)
Total Protein: 7.1 g/dL (ref 6.0–8.3)

## 2019-01-08 LAB — PSA: PSA: 0.01 ng/mL — ABNORMAL LOW (ref 0.10–4.00)

## 2019-01-08 LAB — HEMOGLOBIN A1C: Hgb A1c MFr Bld: 7 % — ABNORMAL HIGH (ref 4.6–6.5)

## 2019-01-08 LAB — LDL CHOLESTEROL, DIRECT: Direct LDL: 44 mg/dL

## 2019-01-08 NOTE — Progress Notes (Signed)
Per orders of Dr. Rogers Blocker, injection of Shingrix #2 given left deltoid IM by Kevan Ny, CMA Patient tolerated injection well.

## 2019-01-12 LAB — HIV ANTIBODY (ROUTINE TESTING W REFLEX): HIV 1&2 Ab, 4th Generation: NONREACTIVE

## 2019-01-22 ENCOUNTER — Encounter: Payer: Self-pay | Admitting: Family Medicine

## 2019-01-24 ENCOUNTER — Other Ambulatory Visit: Payer: Self-pay | Admitting: Family Medicine

## 2019-01-24 DIAGNOSIS — R799 Abnormal finding of blood chemistry, unspecified: Secondary | ICD-10-CM

## 2019-01-24 MED ORDER — ROSUVASTATIN CALCIUM 20 MG PO TABS: 20.0000 mg | ORAL_TABLET | Freq: Every day | ORAL | 3 refills | Status: DC

## 2019-02-12 ENCOUNTER — Other Ambulatory Visit (INDEPENDENT_AMBULATORY_CARE_PROVIDER_SITE_OTHER): Payer: BLUE CROSS/BLUE SHIELD

## 2019-02-12 ENCOUNTER — Other Ambulatory Visit: Payer: Self-pay

## 2019-02-12 DIAGNOSIS — R799 Abnormal finding of blood chemistry, unspecified: Secondary | ICD-10-CM

## 2019-02-12 LAB — BASIC METABOLIC PANEL
BUN: 25 mg/dL — ABNORMAL HIGH (ref 6–23)
CO2: 28 mEq/L (ref 19–32)
Calcium: 9.4 mg/dL (ref 8.4–10.5)
Chloride: 97 mEq/L (ref 96–112)
Creatinine, Ser: 1.37 mg/dL (ref 0.40–1.50)
GFR: 52.33 mL/min — ABNORMAL LOW (ref >60.00)
Glucose, Bld: 126 mg/dL — ABNORMAL HIGH (ref 70–99)
Potassium: 4.5 mEq/L (ref 3.5–5.1)
Sodium: 136 mEq/L (ref 135–145)

## 2019-02-18 ENCOUNTER — Telehealth: Payer: Self-pay | Admitting: Family Medicine

## 2019-02-18 NOTE — Telephone Encounter (Signed)
See request °

## 2019-02-18 NOTE — Telephone Encounter (Signed)
Copied from [place] [phone] [phone]. Topic: Quick Communication - Rx Refill/Question >> Feb 18, 2019  3:06 PM Parke Poisson wrote: Medication:nefazodone (SERZONE) 100 MG tablet   Has the patient contacted their pharmacy?Yes  (Agent: If yes, when and what did the pharmacy advise?) No refills  Preferred Pharmacy (with phone number or street name):[id number] [place] [place] [phone] (Phone) [phone] (Fax)    Agent: Please be advised that RX refills may take up to 3 business days. We ask that you follow-up with your pharmacy.

## 2019-02-19 NOTE — Telephone Encounter (Signed)
Spoke to [place] at [employer] at [employer].  There was a hold placed on Serzone rx for some reason.  Advised to please remove hold and ok to fill rx.

## 2019-02-21 ENCOUNTER — Encounter: Payer: Self-pay | Admitting: Family Medicine

## 2019-02-21 ENCOUNTER — Other Ambulatory Visit: Payer: Self-pay

## 2019-02-21 ENCOUNTER — Ambulatory Visit: Payer: BLUE CROSS/BLUE SHIELD | Admitting: Family Medicine

## 2019-02-21 DIAGNOSIS — H6123 Impacted cerumen, bilateral: Secondary | ICD-10-CM

## 2019-02-21 NOTE — Progress Notes (Signed)
Patient here for nurse visit for ear lavage.  He reports that he has noticed increased difficulty hearing and wax buildup in his ears.  Visual inspection of his ears confirms excessive buildup of wax in b/l ears R > L.  Spent close to 30 mins attempting to clean patient's ears.  Both ears lavaged using a mixture of warm water and peroxide.  I also went in with curettage and removed wax in right ear. Not all was able to be removed due to depth and pain.  Orma Flaming, MD [place]

## 2019-03-24 ENCOUNTER — Encounter: Payer: Self-pay | Admitting: Family Medicine

## 2019-03-27 ENCOUNTER — Telehealth: Payer: Self-pay | Admitting: Family Medicine

## 2019-03-27 MED ORDER — MONTELUKAST SODIUM 10 MG PO TABS: 10.0000 mg | ORAL_TABLET | Freq: Every day | ORAL | 3 refills | Status: DC

## 2019-03-27 NOTE — Telephone Encounter (Signed)
See note

## 2019-03-27 NOTE — Telephone Encounter (Signed)
Patient is checking on the status of his Montelukast (generic for Singulair) 10 mg tablet prescription request.  He wants a 90day supply.  The prescription was requested on 03/24/2019. Patient only has two days left.

## 2019-05-08 ENCOUNTER — Other Ambulatory Visit: Payer: Self-pay | Admitting: Family Medicine

## 2019-05-10 DIAGNOSIS — E119 Type 2 diabetes mellitus without complications: Secondary | ICD-10-CM | POA: Diagnosis not present

## 2019-05-19 ENCOUNTER — Other Ambulatory Visit: Payer: Self-pay

## 2019-05-19 ENCOUNTER — Encounter (INDEPENDENT_AMBULATORY_CARE_PROVIDER_SITE_OTHER): Payer: BC Managed Care – PPO | Admitting: Ophthalmology

## 2019-05-19 DIAGNOSIS — H35033 Hypertensive retinopathy, bilateral: Secondary | ICD-10-CM | POA: Diagnosis not present

## 2019-05-19 DIAGNOSIS — H2513 Age-related nuclear cataract, bilateral: Secondary | ICD-10-CM | POA: Diagnosis not present

## 2019-05-19 DIAGNOSIS — H43813 Vitreous degeneration, bilateral: Secondary | ICD-10-CM | POA: Diagnosis not present

## 2019-05-19 DIAGNOSIS — I10 Essential (primary) hypertension: Secondary | ICD-10-CM

## 2019-07-01 ENCOUNTER — Telehealth: Payer: Self-pay | Admitting: Family Medicine

## 2019-07-01 NOTE — Telephone Encounter (Signed)
Patient sent a message stating he had an eye exam on May 19, 2019. No further information was provided.

## 2019-07-01 NOTE — Telephone Encounter (Signed)
Noted and Health maintenance updated in patient's chart.

## 2019-07-11 ENCOUNTER — Encounter: Payer: Self-pay | Admitting: Family Medicine

## 2019-07-11 ENCOUNTER — Other Ambulatory Visit: Payer: Self-pay

## 2019-07-11 ENCOUNTER — Other Ambulatory Visit: Payer: Self-pay | Admitting: Family Medicine

## 2019-07-11 ENCOUNTER — Ambulatory Visit (INDEPENDENT_AMBULATORY_CARE_PROVIDER_SITE_OTHER): Payer: BC Managed Care – PPO | Admitting: Family Medicine

## 2019-07-11 VITALS — BP 112/64 | HR 65 | Temp 97.4°F | Ht 73.0 in | Wt 228.6 lb

## 2019-07-11 DIAGNOSIS — E119 Type 2 diabetes mellitus without complications: Secondary | ICD-10-CM

## 2019-07-11 DIAGNOSIS — H2589 Other age-related cataract: Secondary | ICD-10-CM | POA: Diagnosis not present

## 2019-07-11 DIAGNOSIS — I10 Essential (primary) hypertension: Secondary | ICD-10-CM

## 2019-07-11 DIAGNOSIS — Z23 Encounter for immunization: Secondary | ICD-10-CM | POA: Diagnosis not present

## 2019-07-11 DIAGNOSIS — N179 Acute kidney failure, unspecified: Secondary | ICD-10-CM

## 2019-07-11 DIAGNOSIS — H269 Unspecified cataract: Secondary | ICD-10-CM | POA: Insufficient documentation

## 2019-07-11 LAB — CBC WITH DIFFERENTIAL/PLATELET
Basophils Absolute: 0 10*3/uL (ref 0.0–0.1)
Basophils Relative: 0.9 % (ref 0.0–3.0)
Eosinophils Absolute: 0.1 10*3/uL (ref 0.0–0.7)
Eosinophils Relative: 1.9 % (ref 0.0–5.0)
HCT: 39.9 % (ref 39.0–52.0)
Hemoglobin: 13.7 g/dL (ref 13.0–17.0)
Lymphocytes Relative: 15.2 % (ref 12.0–46.0)
Lymphs Abs: 0.8 10*3/uL (ref 0.7–4.0)
MCHC: 34.2 g/dL (ref 30.0–36.0)
MCV: 89 fl (ref 78.0–100.0)
Monocytes Absolute: 0.4 10*3/uL (ref 0.1–1.0)
Monocytes Relative: 6.6 % (ref 3.0–12.0)
Neutro Abs: 4 10*3/uL (ref 1.4–7.7)
Neutrophils Relative %: 75.4 % (ref 43.0–77.0)
Platelets: 333 10*3/uL (ref 150.0–400.0)
RBC: 4.48 Mil/uL (ref 4.22–5.81)
RDW: 13.5 % (ref 11.5–15.5)
WBC: 5.3 10*3/uL (ref 4.0–10.5)

## 2019-07-11 LAB — COMPREHENSIVE METABOLIC PANEL
ALT: 14 U/L (ref 0–53)
AST: 14 U/L (ref 0–37)
Albumin: 4.6 g/dL (ref 3.5–5.2)
Alkaline Phosphatase: 70 U/L (ref 39–117)
BUN: 28 mg/dL — ABNORMAL HIGH (ref 6–23)
CO2: 30 mEq/L (ref 19–32)
Calcium: 9.7 mg/dL (ref 8.4–10.5)
Chloride: 99 mEq/L (ref 96–112)
Creatinine, Ser: 1.52 mg/dL — ABNORMAL HIGH (ref 0.40–1.50)
GFR: 46.36 mL/min — ABNORMAL LOW (ref >60.00)
Glucose, Bld: 129 mg/dL — ABNORMAL HIGH (ref 70–99)
Potassium: 4.7 mEq/L (ref 3.5–5.1)
Sodium: 137 mEq/L (ref 135–145)
Total Bilirubin: 0.5 mg/dL (ref 0.2–1.2)
Total Protein: 6.9 g/dL (ref 6.0–8.3)

## 2019-07-11 LAB — HEMOGLOBIN A1C: Hgb A1c MFr Bld: 6.4 % (ref 4.6–6.5)

## 2019-07-11 NOTE — Progress Notes (Signed)
Creatinine and bun increased. Stopping jardiance and metformin. Repeat bmp in 2 weeks. Likely secondary to jardiance as his BUN has been mildly elevated since starting.  Orma Flaming, MD [address]

## 2019-07-11 NOTE — Progress Notes (Signed)
Patient: Theodore Foley MRN: [id number] DOB: October 14, 1955 PCP: Orma Flaming, MD     Subjective:  Chief Complaint  Patient presents with  . Hypertension    HPI: The patient is a 63 y.o. male who presents today for HTN and diabetes follow up.   Hypertension: Here for follow up of hypertension.  Currently on prinzide. Takes medication as prescribed and denies any side effects. Exercise includes walking daily, at least one mile. Weight has been stable. Denies any chest pain, headaches, shortness of breath, vision changes, swelling in lower extremities. Has lost 5 pounds since January.   Diabetes: Patient is here for follow up of type 2 diabetes. Currently on the following medications metformin, jardiance . Takes medications as prescribed. Last A1C was 7.0. Currently exercising and following diabetic diet. Sugars range from 90 to 115. Denies any hypoglycemic events. Denies any vision changes, nausea, vomiting, abdominal pain, ulcers/paraesthesia in feet, polyuria, polydipsia or polyphagia. Denies any chest pain, shortness of breath. Lost 5 pounds since January.   Being evaluated for cataract surgery. Should have this done this year.   Review of Systems  Constitutional: Negative for chills, fatigue and fever.  HENT: Positive for congestion. Negative for dental problem, ear pain, hearing loss, postnasal drip, rhinorrhea and trouble swallowing.   Eyes: Positive for visual disturbance.  Respiratory: Negative for cough, chest tightness and shortness of breath.   Cardiovascular: Negative for chest pain, palpitations and leg swelling.  Gastrointestinal: Negative for abdominal pain, blood in stool, diarrhea, nausea and vomiting.  Endocrine: Negative for cold intolerance, polydipsia, polyphagia and polyuria.  Genitourinary: Negative for dysuria and hematuria.  Musculoskeletal: Negative for arthralgias.  Skin: Negative for rash.  Neurological: Negative for dizziness and headaches.   Psychiatric/Behavioral: Positive for sleep disturbance. Negative for dysphoric mood. The patient is not nervous/anxious.     Allergies Patient has No Known Allergies.  Past Medical History Patient  has a past medical history of Anxiety, Cancer ([place]), Colon polyps, Depression, Diabetes mellitus without complication ([place]), GERD (gastroesophageal reflux disease), Hyperlipidemia, Hypertension, Sleep apnea, and Urine incontinence.  Surgical History Patient  has a past surgical history that includes Prostatectomy (2017); Hemicolectomy (Right, 2012); Appendectomy (2012); and Colonoscopy.  Family History Pateint's family history includes Alcohol abuse in his sister; Cancer in his brother and maternal grandfather; Depression in his brother, daughter, daughter, daughter, father, paternal grandmother, son, and son; Diabetes in his brother; Early death in his brother; Hearing loss in his brother; Heart attack in his brother, mother, and paternal grandfather; Heart disease in his brother, maternal grandmother, mother, and paternal grandfather; Hyperlipidemia in his brother and father; Hypertension in his brother, brother, father, and maternal grandmother; Miscarriages / [place] in his mother; Pancreatic cancer in his brother.  Social History Patient  reports that he has quit smoking. He has never used smokeless tobacco. He reports current alcohol use of about 6.0 standard drinks of alcohol per week. He reports that he does not use drugs.    Objective: Vitals:   07/11/19 0801  BP: 112/64  Pulse: 65  Temp: (!) 97.4 F (36.3 C)  TempSrc: Skin  SpO2: 96%  Weight: 228 lb 9.6 oz (103.7 kg)  Height: 6\' 1"  (1.854 m)    Body mass index is 30.16 kg/m.  Physical Exam Vitals signs reviewed.  Constitutional:      Appearance: Normal appearance. He is well-developed.  HENT:     Head: Normocephalic and atraumatic.     Ears:     Comments: Bilateral ha Eyes:  Extraocular Movements: Extraocular  movements intact.     Conjunctiva/sclera: Conjunctivae normal.     Pupils: Pupils are equal, round, and reactive to light.  Neck:     Musculoskeletal: Normal range of motion and neck supple.     Thyroid: No thyromegaly.  Cardiovascular:     Rate and Rhythm: Normal rate and regular rhythm.     Heart sounds: Normal heart sounds. No murmur.  Pulmonary:     Effort: Pulmonary effort is normal.     Breath sounds: Normal breath sounds.  Abdominal:     General: Abdomen is flat. Bowel sounds are normal. There is no distension.     Palpations: Abdomen is soft.     Tenderness: There is no abdominal tenderness.  Lymphadenopathy:     Cervical: No cervical adenopathy.  Skin:    General: Skin is warm and dry.     Findings: No rash.  Neurological:     General: No focal deficit present.     Mental Status: He is alert and oriented to person, place, and time.     Cranial Nerves: No cranial nerve deficit.     Coordination: Coordination normal.     Deep Tendon Reflexes: Reflexes normal.  Psychiatric:        Mood and Affect: Mood normal.        Behavior: Behavior normal.        Depression screen [facility] 2/9 07/11/2019 07/03/2018 07/03/2018  Decreased Interest 0 0 0  Down, Depressed, Hopeless 0 0 0  PHQ - 2 Score 0 0 0  Altered sleeping - 3 -  Tired, decreased energy - 2 -  Change in appetite - 1 -  Feeling bad or failure about yourself  - 1 -  Trouble concentrating - 2 -  Moving slowly or fidgety/restless - 0 -  Suicidal thoughts - 0 -  PHQ-9 Score - 9 -  Difficult doing work/chores - Somewhat difficult -    Assessment/plan: 1. Essential hypertension Blood pressure is to goal. Continue current anti-hypertensive medications. Refills not needed. Routine lab work will be done today. Recommended routine exercise and healthy diet including DASH diet and mediterranean diet. Encouraged weight loss. F/u in 6 months.    2. Diabetes mellitus without complication ([place]) Lost weight, watching diet and  fasting sugars to goal. Checking a1c/renal function today. On statin/aCEI, high dose flu today, eye exam completed and foot exam done. Continue healthy, diabetic diet/exercise and continued weight loss. Doing well at getting in exercise daily. F/u on labs and f/u with me in 3-6 months. Hypoglycemic precautions given.   3. Flu shot-high dose.    Return in about 6 months (around 01/09/2020) for fasting labs/annual .   Orma Flaming, MD [place]   07/11/2019

## 2019-07-17 ENCOUNTER — Encounter: Payer: Self-pay | Admitting: Family Medicine

## 2019-07-28 DIAGNOSIS — E113293 Type 2 diabetes mellitus with mild nonproliferative diabetic retinopathy without macular edema, bilateral: Secondary | ICD-10-CM | POA: Diagnosis not present

## 2019-07-28 DIAGNOSIS — H2511 Age-related nuclear cataract, right eye: Secondary | ICD-10-CM | POA: Diagnosis not present

## 2019-07-28 DIAGNOSIS — H35033 Hypertensive retinopathy, bilateral: Secondary | ICD-10-CM | POA: Diagnosis not present

## 2019-07-28 DIAGNOSIS — H52213 Irregular astigmatism, bilateral: Secondary | ICD-10-CM | POA: Diagnosis not present

## 2019-07-28 DIAGNOSIS — H2513 Age-related nuclear cataract, bilateral: Secondary | ICD-10-CM | POA: Diagnosis not present

## 2019-07-28 DIAGNOSIS — H25013 Cortical age-related cataract, bilateral: Secondary | ICD-10-CM | POA: Diagnosis not present

## 2019-07-29 ENCOUNTER — Other Ambulatory Visit (INDEPENDENT_AMBULATORY_CARE_PROVIDER_SITE_OTHER): Payer: BC Managed Care – PPO

## 2019-07-29 ENCOUNTER — Other Ambulatory Visit: Payer: Self-pay

## 2019-07-29 DIAGNOSIS — N179 Acute kidney failure, unspecified: Secondary | ICD-10-CM | POA: Diagnosis not present

## 2019-07-29 LAB — BASIC METABOLIC PANEL
BUN: 19 mg/dL (ref 6–23)
CO2: 30 mEq/L (ref 19–32)
Calcium: 9.4 mg/dL (ref 8.4–10.5)
Chloride: 99 mEq/L (ref 96–112)
Creatinine, Ser: 1.23 mg/dL (ref 0.40–1.50)
GFR: 59.17 mL/min — ABNORMAL LOW (ref >60.00)
Glucose, Bld: 158 mg/dL — ABNORMAL HIGH (ref 70–99)
Potassium: 4.3 mEq/L (ref 3.5–5.1)
Sodium: 137 mEq/L (ref 135–145)

## 2019-07-30 ENCOUNTER — Encounter: Payer: Self-pay | Admitting: Family Medicine

## 2019-07-31 ENCOUNTER — Encounter: Payer: Self-pay | Admitting: Family Medicine

## 2019-08-01 ENCOUNTER — Encounter: Payer: Self-pay | Admitting: Family Medicine

## 2019-08-01 ENCOUNTER — Other Ambulatory Visit: Payer: Self-pay | Admitting: Family Medicine

## 2019-08-01 MED ORDER — TRULICITY 0.75 MG/0.5ML ~~LOC~~ SOAJ: 0.7500 mg | SUBCUTANEOUS | 0 refills | Status: DC

## 2019-08-12 DIAGNOSIS — H52211 Irregular astigmatism, right eye: Secondary | ICD-10-CM | POA: Diagnosis not present

## 2019-08-12 DIAGNOSIS — H2511 Age-related nuclear cataract, right eye: Secondary | ICD-10-CM | POA: Diagnosis not present

## 2019-08-12 DIAGNOSIS — H25811 Combined forms of age-related cataract, right eye: Secondary | ICD-10-CM | POA: Diagnosis not present

## 2019-08-26 ENCOUNTER — Encounter: Payer: Self-pay | Admitting: Family Medicine

## 2019-08-27 ENCOUNTER — Other Ambulatory Visit: Payer: Self-pay

## 2019-08-27 MED ORDER — TRULICITY 1.5 MG/0.5ML ~~LOC~~ SOAJ: 1.5000 mg | SUBCUTANEOUS | 3 refills | Status: DC

## 2019-09-03 DIAGNOSIS — H2512 Age-related nuclear cataract, left eye: Secondary | ICD-10-CM | POA: Diagnosis not present

## 2019-09-03 DIAGNOSIS — H25012 Cortical age-related cataract, left eye: Secondary | ICD-10-CM | POA: Diagnosis not present

## 2019-09-09 DIAGNOSIS — H25012 Cortical age-related cataract, left eye: Secondary | ICD-10-CM | POA: Diagnosis not present

## 2019-09-09 DIAGNOSIS — H2512 Age-related nuclear cataract, left eye: Secondary | ICD-10-CM | POA: Diagnosis not present

## 2019-09-09 DIAGNOSIS — H25812 Combined forms of age-related cataract, left eye: Secondary | ICD-10-CM | POA: Diagnosis not present

## 2019-09-09 DIAGNOSIS — H52212 Irregular astigmatism, left eye: Secondary | ICD-10-CM | POA: Diagnosis not present

## 2019-09-23 ENCOUNTER — Encounter: Payer: Self-pay | Admitting: Family Medicine

## 2019-09-24 ENCOUNTER — Other Ambulatory Visit: Payer: Self-pay | Admitting: Family Medicine

## 2019-09-24 ENCOUNTER — Other Ambulatory Visit: Payer: Self-pay

## 2019-09-24 MED ORDER — NEFAZODONE HCL 100 MG PO TABS: 100.0000 mg | ORAL_TABLET | Freq: Two times a day (BID) | ORAL | 1 refills | Status: DC

## 2019-10-10 ENCOUNTER — Telehealth: Payer: Self-pay

## 2019-10-10 NOTE — Telephone Encounter (Signed)
Received a fax from [employer] [facility] [employer] on [address] advising that Nefazadone 100 mg that was sent in on 09/24/19 had to be ordered and was due in on 1/14.  Fax stated that there was a back order.  Spoke to patient who [facility] [address].    Spoke to [place] at [employer] who stated that last bottle of Nefazadone was just given to another patient.  MyChart message sent to pt asking if there was another pharmacy that he wanted [place] to try.

## 2019-11-01 ENCOUNTER — Telehealth: Payer: BC Managed Care – PPO | Admitting: Emergency Medicine

## 2019-11-01 DIAGNOSIS — R300 Dysuria: Secondary | ICD-10-CM

## 2019-11-01 NOTE — Progress Notes (Signed)
Your symptoms seem consistent with a urinary tract infection.  Unfortunately, male urinary tract infections do no fall within the scope of practice for an e-visit.  I'm sorry, I'm not permitted to treat you for this.  Based on what you shared with me, I feel your condition warrants further evaluation and I recommend that you be seen for a face to face office visit.   NOTE: If you entered your credit card information for this eVisit, you will not be charged. You may see a "hold" on your card for the $35 but that hold will drop off and you will not have a charge processed.   If you are having a true medical emergency please call 911.      For an urgent face to face visit, [place] has five urgent care centers for your convenience:      NEW:  [facility] [place] at [place] Get Driving Directions [id number] [place], [place] [address] . 10 am - 6pm Monday - Friday    [facility]) Get Driving Directions [id number] [address] [place], [place] [address] . 10 am to 8 pm Monday-Friday . 12 pm to 8 pm [facility] at [facility] Get Driving Directions [id number] [place], [place], [place] [address] . 8 am to 8 pm Monday-Friday . 9 am to 6 pm Saturday . 11 am to 6 pm Sunday     [facility] at [facility] Get Driving Directions  [id number] [address] [place], [place] [address] . 8 am to 8 pm Monday-Friday . 8 am to 4 pm [facility] at [place] Get Driving Directions [id number] [address] [place], [place] [address] . 12 pm to 6 pm Monday-Friday      Your e-visit answers were reviewed by a board certified advanced clinical practitioner to complete your personal care plan.  Thank you for using e-Visits.   Approximately 5 minutes was used in reviewing the  patient's chart, questionnaire, prescribing medications, and documentation.

## 2019-11-05 ENCOUNTER — Encounter: Payer: Self-pay | Admitting: Family Medicine

## 2019-11-05 ENCOUNTER — Other Ambulatory Visit: Payer: Self-pay

## 2019-11-05 ENCOUNTER — Ambulatory Visit (INDEPENDENT_AMBULATORY_CARE_PROVIDER_SITE_OTHER): Payer: BC Managed Care – PPO | Admitting: Family Medicine

## 2019-11-05 ENCOUNTER — Other Ambulatory Visit: Payer: Self-pay | Admitting: Family Medicine

## 2019-11-05 VITALS — BP 110/70 | HR 89 | Ht 73.0 in | Wt 246.0 lb

## 2019-11-05 DIAGNOSIS — R300 Dysuria: Secondary | ICD-10-CM | POA: Diagnosis not present

## 2019-11-05 DIAGNOSIS — Z125 Encounter for screening for malignant neoplasm of prostate: Secondary | ICD-10-CM

## 2019-11-05 DIAGNOSIS — Z8546 Personal history of malignant neoplasm of prostate: Secondary | ICD-10-CM

## 2019-11-05 DIAGNOSIS — E119 Type 2 diabetes mellitus without complications: Secondary | ICD-10-CM

## 2019-11-05 DIAGNOSIS — R972 Elevated prostate specific antigen [PSA]: Secondary | ICD-10-CM

## 2019-11-05 LAB — COMPREHENSIVE METABOLIC PANEL
ALT: 21 U/L (ref 0–53)
AST: 18 U/L (ref 0–37)
Albumin: 4.3 g/dL (ref 3.5–5.2)
Alkaline Phosphatase: 80 U/L (ref 39–117)
BUN: 21 mg/dL (ref 6–23)
CO2: 29 mEq/L (ref 19–32)
Calcium: 9.5 mg/dL (ref 8.4–10.5)
Chloride: 98 mEq/L (ref 96–112)
Creatinine, Ser: 1.36 mg/dL (ref 0.40–1.50)
GFR: 52.65 mL/min — ABNORMAL LOW (ref >60.00)
Glucose, Bld: 183 mg/dL — ABNORMAL HIGH (ref 70–99)
Potassium: 4.4 mEq/L (ref 3.5–5.1)
Sodium: 134 mEq/L — ABNORMAL LOW (ref 135–145)
Total Bilirubin: 0.5 mg/dL (ref 0.2–1.2)
Total Protein: 7.1 g/dL (ref 6.0–8.3)

## 2019-11-05 LAB — CBC WITH DIFFERENTIAL/PLATELET
Basophils Absolute: 0 10*3/uL (ref 0.0–0.1)
Basophils Relative: 0.4 % (ref 0.0–3.0)
Eosinophils Absolute: 0.1 10*3/uL (ref 0.0–0.7)
Eosinophils Relative: 1.8 % (ref 0.0–5.0)
HCT: 38.8 % — ABNORMAL LOW (ref 39.0–52.0)
Hemoglobin: 13 g/dL (ref 13.0–17.0)
Lymphocytes Relative: 17.2 % (ref 12.0–46.0)
Lymphs Abs: 1.1 10*3/uL (ref 0.7–4.0)
MCHC: 33.4 g/dL (ref 30.0–36.0)
MCV: 87.7 fl (ref 78.0–100.0)
Monocytes Absolute: 0.4 10*3/uL (ref 0.1–1.0)
Monocytes Relative: 5.8 % (ref 3.0–12.0)
Neutro Abs: 4.8 10*3/uL (ref 1.4–7.7)
Neutrophils Relative %: 74.8 % (ref 43.0–77.0)
Platelets: 397 10*3/uL (ref 150.0–400.0)
RBC: 4.42 Mil/uL (ref 4.22–5.81)
RDW: 12.9 % (ref 11.5–15.5)
WBC: 6.5 10*3/uL (ref 4.0–10.5)

## 2019-11-05 LAB — POCT URINALYSIS DIPSTICK
Bilirubin, UA: POSITIVE
Blood, UA: POSITIVE
Glucose, UA: NEGATIVE
Ketones, UA: POSITIVE
Nitrite, UA: NEGATIVE
Protein, UA: POSITIVE — AB
Spec Grav, UA: 1.02 (ref 1.010–1.025)
Urobilinogen, UA: 0.2 E.U./dL
pH, UA: 5.5 (ref 5.0–8.0)

## 2019-11-05 LAB — PSA: PSA: 5.28 ng/mL — ABNORMAL HIGH (ref 0.10–4.00)

## 2019-11-05 MED ORDER — CIPROFLOXACIN HCL 500 MG PO TABS: 500.0000 mg | ORAL_TABLET | Freq: Two times a day (BID) | ORAL | 0 refills | Status: AC

## 2019-11-05 MED ORDER — SULFAMETHOXAZOLE-TRIMETHOPRIM 800-160 MG PO TABS: 1.0000 | ORAL_TABLET | Freq: Two times a day (BID) | ORAL | 0 refills | Status: DC

## 2019-11-05 NOTE — Patient Instructions (Signed)
-  so urine looks dirty/dehydrated and you have blood.  -checking culture, labs/PSA (kidneys/cbc)  -sending in antibiotic to start.  -will repeat urine to make sure blood resolves in a few weeks-if not urology.  -if culture comes back negative, will need to do further work up.   i'll be in touch! Aw

## 2019-11-05 NOTE — Progress Notes (Signed)
Patient: Theodore Foley MRN: [id number] DOB: [birth year] PCP: Orma Flaming, MD     Subjective:  Chief Complaint  Patient presents with  . Urinary Tract Infection    HPI: The patient is a 64 y.o. male who presents today for dysuria. He states it started about 10 days ago. [facility] and gave him relief for about 2 days. He has really increased water and drank a lot of cranberry juice. He thinks dehydration may have played a key as well. He states since his prostatectomy he has had to wear pads at night and wonders if this has contributed. He has increased frequency of pad changing and trying to urinate more frequently. He has extreme pain at the end of urination. No visible blood in his urine. He has increased urgency, but no frequency. No fever/chills. No CVA tenderness, no N/V. He has not had a UTI in the past that he can recall. He does not have a prostate and has no pain with defecation. He is not sexually active. No radical change to diet.   He is diabetic and his sugars have been very well controlled at 90-115.   Has not had his covid vaccine.   Review of Systems  Constitutional: Negative for chills, fatigue and fever.  HENT: Positive for congestion. Negative for sneezing and sore throat.        Mild congestion  Respiratory: Negative for shortness of breath.   Cardiovascular: Negative for chest pain.  Genitourinary: Positive for dysuria and urgency. Negative for decreased urine volume, difficulty urinating, discharge, flank pain, frequency, genital sores and hematuria.  Musculoskeletal: Negative for arthralgias and back pain.  Skin: Negative for rash.    Allergies Patient has No Known Allergies.  Past Medical History Patient  has a past medical history of Anxiety, Cancer ([place]), Colon polyps, Depression, Diabetes mellitus without complication ([place]), GERD (gastroesophageal reflux disease), Hyperlipidemia, Hypertension, Sleep apnea, and Urine incontinence.  Surgical  History Patient  has a past surgical history that includes Prostatectomy (2017); Hemicolectomy (Right, 2012); Appendectomy (2012); and Colonoscopy.  Family History Pateint's family history includes Alcohol abuse in his sister; Cancer in his brother and maternal grandfather; Depression in his brother, daughter, daughter, daughter, father, paternal grandmother, son, and son; Diabetes in his brother; Early death in his brother; Hearing loss in his brother; Heart attack in his brother, mother, and paternal grandfather; Heart disease in his brother, maternal grandmother, mother, and paternal grandfather; Hyperlipidemia in his brother and father; Hypertension in his brother, brother, father, and maternal grandmother; Miscarriages / [place] in his mother; Pancreatic cancer in his brother.  Social History Patient  reports that he has quit smoking. He has never used smokeless tobacco. He reports current alcohol use of about 6.0 standard drinks of alcohol per week. He reports that he does not use drugs.    Objective: Vitals:   11/05/19 1059  BP: 110/70  Pulse: 89  SpO2: 93%  Weight: 246 lb (111.6 kg)  Height: 6\' 1"  (1.854 m)    Body mass index is 32.46 kg/m.  Physical Exam Vitals reviewed.  Constitutional:      Appearance: Normal appearance. He is normal weight.  HENT:     Head: Normocephalic and atraumatic.  Cardiovascular:     Rate and Rhythm: Normal rate and regular rhythm.     Heart sounds: Normal heart sounds.  Pulmonary:     Breath sounds: Normal breath sounds.  Abdominal:     General: Abdomen is flat. Bowel sounds  are normal.     Palpations: Abdomen is soft.     Tenderness: There is no abdominal tenderness. There is no right CVA tenderness or left CVA tenderness.  Skin:    General: Skin is warm.  Neurological:     General: No focal deficit present.     Mental Status: He is alert and oriented to person, place, and time.  Psychiatric:        Mood and Affect: Mood normal.         Behavior: Behavior normal.    Urine dipstick shows positive for blood, positive for protein, positive for leukocytes, positive for urobilinogen and positive for ketones.  Micro exam: not done.      Assessment/plan: 1. Dysuria Appears to be uncomplicated UTI. Starting 5 day course of bactrim, culture pending. Also checking labs/PSA with history. Encouraged him to drink plenty of water as he looks a little dehydrated. Has tolerated bactrim in past. Will f/u on culture. Any fever/increased pain, CVA tenderness instructed to go to ER.  - POCT urinalysis dipstick - Urine Culture - CBC with Differential/Platelet - Comprehensive metabolic panel - PSA    Total time of encounter:  25 minutes total time of encounter, including 17 minutes spent in face-to-face patient care. This time includes coordination of care and counseling regarding dysuria/possible UTI, work up and treatment. Remainder of non-face-to-face time involved reviewing chart documents/testing relevant to the patient encounter and documentation in the medical record.  This visit occurred during the SARS-CoV-2 public health emergency.  Safety protocols were in place, including screening questions prior to the visit, additional usage of staff PPE, and extensive cleaning of exam room while observing appropriate contact time as indicated for disinfecting solutions.     Return if symptoms worsen or fail to improve.    Orma Flaming, MD [place]   11/05/2019

## 2019-11-06 LAB — URINE CULTURE
MICRO NUMBER:: 10137682
SPECIMEN QUALITY:: ADEQUATE

## 2019-11-11 ENCOUNTER — Telehealth: Payer: Self-pay

## 2019-11-11 NOTE — Telephone Encounter (Signed)
Received fax from Pharmacy:  Nefazodone is on a long term back order. Please discuss options with patient.   Please Advise.

## 2019-11-12 NOTE — Telephone Encounter (Signed)
Patient has weaned off. Does not need at this time.  Theodore Flaming, MD [place]

## 2019-11-17 ENCOUNTER — Other Ambulatory Visit: Payer: Self-pay

## 2019-11-17 ENCOUNTER — Other Ambulatory Visit (INDEPENDENT_AMBULATORY_CARE_PROVIDER_SITE_OTHER): Payer: BC Managed Care – PPO

## 2019-11-17 ENCOUNTER — Ambulatory Visit: Payer: BC Managed Care – PPO | Admitting: Family Medicine

## 2019-11-17 DIAGNOSIS — E119 Type 2 diabetes mellitus without complications: Secondary | ICD-10-CM | POA: Diagnosis not present

## 2019-11-17 LAB — MICROALBUMIN / CREATININE URINE RATIO
Creatinine,U: 104.9 mg/dL
Microalb Creat Ratio: 1.2 mg/g (ref 0.0–30.0)
Microalb, Ur: 1.2 mg/dL (ref 0.0–1.9)

## 2019-11-17 LAB — HEMOGLOBIN A1C: Hgb A1c MFr Bld: 6.2 % (ref 4.6–6.5)

## 2019-11-17 LAB — TSH: TSH: 0.96 u[IU]/mL (ref 0.35–4.50)

## 2019-11-18 LAB — LIPID PANEL
Cholesterol: 115 mg/dL (ref 0–200)
HDL: 41.3 mg/dL (ref >39.00)
NonHDL: 74.19
Total CHOL/HDL Ratio: 3
Triglycerides: 234 mg/dL — ABNORMAL HIGH (ref 0.0–149.0)
VLDL: 46.8 mg/dL — ABNORMAL HIGH (ref 0.0–40.0)

## 2019-11-18 LAB — LDL CHOLESTEROL, DIRECT: Direct LDL: 52 mg/dL

## 2019-11-27 ENCOUNTER — Other Ambulatory Visit (HOSPITAL_COMMUNITY): Payer: Self-pay | Admitting: Urology

## 2019-11-27 DIAGNOSIS — R300 Dysuria: Secondary | ICD-10-CM | POA: Diagnosis not present

## 2019-11-27 DIAGNOSIS — N393 Stress incontinence (female) (male): Secondary | ICD-10-CM | POA: Diagnosis not present

## 2019-11-27 DIAGNOSIS — C61 Malignant neoplasm of prostate: Secondary | ICD-10-CM | POA: Diagnosis not present

## 2019-12-12 ENCOUNTER — Encounter (HOSPITAL_COMMUNITY)
Admission: RE | Admit: 2019-12-12 | Discharge: 2019-12-12 | Disposition: A | Discharge status: Home / Self Care | Payer: BC Managed Care – PPO | Source: Ambulatory Visit | Attending: Urology | Admitting: Urology

## 2019-12-12 ENCOUNTER — Other Ambulatory Visit: Payer: Self-pay

## 2019-12-12 DIAGNOSIS — C61 Malignant neoplasm of prostate: Secondary | ICD-10-CM | POA: Insufficient documentation

## 2019-12-12 MED ORDER — TECHNETIUM TC 99M MEDRONATE IV KIT
20.0000 | PACK | Freq: Once | INTRAVENOUS | Status: AC | PRN
  Administered 2019-12-12: 22 via INTRAVENOUS

## 2019-12-16 ENCOUNTER — Other Ambulatory Visit: Payer: Self-pay | Admitting: Urology

## 2019-12-16 ENCOUNTER — Other Ambulatory Visit: Payer: Self-pay | Admitting: Family Medicine

## 2019-12-16 DIAGNOSIS — M8580 Other specified disorders of bone density and structure, unspecified site: Secondary | ICD-10-CM

## 2019-12-16 DIAGNOSIS — C61 Malignant neoplasm of prostate: Secondary | ICD-10-CM | POA: Diagnosis not present

## 2019-12-16 DIAGNOSIS — N393 Stress incontinence (female) (male): Secondary | ICD-10-CM | POA: Diagnosis not present

## 2019-12-17 ENCOUNTER — Other Ambulatory Visit: Payer: Self-pay | Admitting: Family Medicine

## 2019-12-30 DIAGNOSIS — C61 Malignant neoplasm of prostate: Secondary | ICD-10-CM | POA: Diagnosis not present

## 2020-02-16 ENCOUNTER — Other Ambulatory Visit: Payer: Self-pay | Admitting: Family Medicine

## 2020-03-02 ENCOUNTER — Ambulatory Visit
Admission: RE | Admit: 2020-03-02 | Discharge: 2020-03-02 | Disposition: A | Discharge status: Home / Self Care | Payer: BC Managed Care – PPO | Source: Ambulatory Visit | Attending: Urology | Admitting: Urology

## 2020-03-02 ENCOUNTER — Other Ambulatory Visit: Payer: Self-pay

## 2020-03-02 DIAGNOSIS — M8580 Other specified disorders of bone density and structure, unspecified site: Secondary | ICD-10-CM

## 2020-03-03 ENCOUNTER — Encounter (INDEPENDENT_AMBULATORY_CARE_PROVIDER_SITE_OTHER): Payer: Self-pay

## 2020-03-27 ENCOUNTER — Other Ambulatory Visit: Payer: Self-pay | Admitting: Family Medicine

## 2020-04-09 ENCOUNTER — Encounter: Payer: Self-pay | Admitting: Family Medicine

## 2020-04-09 DIAGNOSIS — C61 Malignant neoplasm of prostate: Secondary | ICD-10-CM | POA: Insufficient documentation

## 2020-04-16 ENCOUNTER — Encounter: Payer: Self-pay | Admitting: Family Medicine

## 2020-04-16 NOTE — Telephone Encounter (Signed)
Please advise 

## 2020-04-19 LAB — HM DIABETES EYE EXAM

## 2020-04-26 ENCOUNTER — Encounter: Payer: Self-pay | Admitting: Family Medicine

## 2020-05-26 ENCOUNTER — Telehealth: Payer: Self-pay | Admitting: Family Medicine

## 2020-05-26 ENCOUNTER — Encounter: Payer: Self-pay | Admitting: Family Medicine

## 2020-05-26 NOTE — Telephone Encounter (Signed)
Patient is requesting pneumonia vaccine and is scheduled flu vaccine for Tuesday.  Patient is requesting to have both shots done before sept 13 th he will be going out of town  Please advise ?

## 2020-05-27 NOTE — Telephone Encounter (Signed)
Per MyChart pt was told to schedule lab appointment for both. Please schedule pt for lab app.   Thank You

## 2020-05-27 NOTE — Telephone Encounter (Signed)
Patient scheduled.

## 2020-06-01 ENCOUNTER — Encounter: Payer: Self-pay | Admitting: Family Medicine

## 2020-06-01 ENCOUNTER — Ambulatory Visit (INDEPENDENT_AMBULATORY_CARE_PROVIDER_SITE_OTHER): Payer: Medicare Other

## 2020-06-01 ENCOUNTER — Other Ambulatory Visit: Payer: Self-pay

## 2020-06-01 DIAGNOSIS — Z23 Encounter for immunization: Secondary | ICD-10-CM

## 2020-06-01 MED ORDER — PNEUMOCOCCAL VAC POLYVALENT 25 MCG/0.5ML IJ INJ: 0.5000 mL | INJECTION | INTRAMUSCULAR | 0 refills | Status: AC

## 2020-06-01 NOTE — Progress Notes (Signed)
I

## 2020-06-01 NOTE — Addendum Note (Signed)
Addended by: Jerrel Ivory D on: 06/01/2020 02:27 PM   Modules accepted: Orders

## 2020-06-01 NOTE — Addendum Note (Signed)
Addended by: Jerrel Ivory D on: 06/01/2020 02:22 PM   Modules accepted: Orders

## 2020-07-07 ENCOUNTER — Other Ambulatory Visit: Payer: Self-pay

## 2020-07-07 ENCOUNTER — Encounter: Payer: Self-pay | Admitting: Family Medicine

## 2020-07-07 MED ORDER — BUPROPION HCL ER (SR) 200 MG PO TB12: 200.0000 mg | ORAL_TABLET | Freq: Two times a day (BID) | ORAL | 1 refills | Status: DC

## 2020-07-24 ENCOUNTER — Other Ambulatory Visit: Payer: Self-pay | Admitting: Family Medicine

## 2020-07-28 ENCOUNTER — Other Ambulatory Visit: Payer: Self-pay

## 2020-07-28 ENCOUNTER — Encounter: Payer: Self-pay | Admitting: Family Medicine

## 2020-07-28 ENCOUNTER — Ambulatory Visit (INDEPENDENT_AMBULATORY_CARE_PROVIDER_SITE_OTHER): Payer: Medicare Other | Admitting: Family Medicine

## 2020-07-28 VITALS — BP 110/73 | Temp 98.0°F | Ht 73.0 in | Wt 243.2 lb

## 2020-07-28 DIAGNOSIS — R442 Other hallucinations: Secondary | ICD-10-CM | POA: Diagnosis not present

## 2020-07-28 DIAGNOSIS — M25551 Pain in right hip: Secondary | ICD-10-CM | POA: Diagnosis not present

## 2020-07-28 DIAGNOSIS — S60449A External constriction of unspecified finger, initial encounter: Secondary | ICD-10-CM

## 2020-07-28 DIAGNOSIS — W4904XA Ring or other jewelry causing external constriction, initial encounter: Secondary | ICD-10-CM

## 2020-07-28 DIAGNOSIS — I10 Essential (primary) hypertension: Secondary | ICD-10-CM

## 2020-07-28 DIAGNOSIS — E119 Type 2 diabetes mellitus without complications: Secondary | ICD-10-CM

## 2020-07-28 MED ORDER — METFORMIN HCL ER (OSM) 500 MG PO TB24: 500.0000 mg | ORAL_TABLET | Freq: Three times a day (TID) | ORAL | 1 refills | Status: DC

## 2020-07-28 NOTE — Patient Instructions (Addendum)
Checking basic labs for you today!   -refilled metformin, everything else you should be good on.  -foot exam normal. Make sure you check bottoms of feet once weekly.   Proud of you! See you in 6 months, fasting at that time.   Have a wonderful thanksgiving and christmas!  alli   I have ordered xrays for you. At this time we do not have xrays in our clinic. You will have to go to our [place] [facility]. The address is [address]  xray is located in the basement.  Hours of operation are M-F 8:30am to 5:00pm.  Closed for lunch between 12:30 and 1:00pm.

## 2020-07-28 NOTE — Progress Notes (Signed)
Patient: Theodore Foley MRN: [id number] DOB: 12-25-1955 PCP: Orma Flaming, MD     Subjective:  Chief Complaint  Patient presents with  . Diabetes  . Hypertension  . right hip pain  . stuck wedding ring  . olfactory hallucinations    HPI: The patient is a 64 y.o. male who presents today for DM follow up.  Diabetes: Patient is here for follow up of type 2 diabetes. Currently on the following medications trulicity and metformin. Takes medications as prescribed. Last A1C was 6.2. Currently exercising and following diabetic diet. Sugars range from 90 to 135. Denies any hypoglycemic events. Denies any vision changes, nausea, vomiting, abdominal pain, ulcers/paraesthesia in feet, polyuria, polydipsia or polyphagia. Denies any chest pain, shortness of breath.   Hypertension: Here for follow up of hypertension.  Currently on zestoretic 20-12.5mg  BId. Takes medication as prescribed and denies any side effects. Exercise includes walking. Weight has been stable. Denies any chest pain, headaches, shortness of breath, vision changes, swelling in lower extremities.   Has has some odd olfactory senses. Started before covid vaccines and after he was put on cipro. He will get a weird smell that shouldn't be there. It's not all of the time.   He has some right hip pain and wonders if he has some arthritis. He has had to order a cane. If he is standing for a long time he has to sit down due to the pain. Pain started about 1.5-2 weeks ago and is getting progressively worse. Has taken motrin and voltaren with some relief. Has done a heating pad as well. Some pain with sitting. No trauma or precipitating event.   Wedding ring is stuck and he needs removed. He has tried the string method, lubricant, etc. To no avail. He had surgery on this finger and his PIP joint is so enlarged he doesn't think it will go over it. No ischemia, pain, swelling, color changes. Been like this for awhile.   In treatment for recurrent  prostate cancer.   Has had covid and flu shots.   Review of Systems  Constitutional: Negative for chills, fatigue and fever.  HENT:       Phantom smells   Respiratory: Negative for chest tightness and shortness of breath.   Cardiovascular: Negative for chest pain, palpitations and leg swelling.  Genitourinary: Negative for frequency and urgency.  Musculoskeletal: Positive for arthralgias.  Neurological: Negative for dizziness, light-headedness and headaches.    Allergies Patient has No Known Allergies.  Past Medical History Patient  has a past medical history of Anxiety, Cancer ([place]), Colon polyps, Depression, Diabetes mellitus without complication ([place]), GERD (gastroesophageal reflux disease), Hyperlipidemia, Hypertension, Sleep apnea, and Urine incontinence.  Surgical History Patient  has a past surgical history that includes Prostatectomy (2017); Hemicolectomy (Right, 2012); Appendectomy (2012); and Colonoscopy.  Family History Pateint's family history includes Alcohol abuse in his sister; Cancer in his brother and maternal grandfather; Depression in his brother, daughter, daughter, daughter, father, paternal grandmother, son, and son; Diabetes in his brother; Early death in his brother; Hearing loss in his brother; Heart attack in his brother, mother, and paternal grandfather; Heart disease in his brother, maternal grandmother, mother, and paternal grandfather; Hyperlipidemia in his brother and father; Hypertension in his brother, brother, father, and maternal grandmother; Miscarriages / [place] in his mother; Pancreatic cancer in his brother.  Social History Patient  reports that he has quit smoking. He has never used smokeless tobacco. He reports current alcohol use of about 6.0 standard drinks of  alcohol per week. He reports that he does not use drugs.    Objective: Vitals:   07/28/20 1006  BP: 110/73  Temp: 98 F (36.7 C)  TempSrc: Temporal  Weight: 243 lb 3.2 oz  (110.3 kg)  Height: 6\' 1"  (1.854 m)    Body mass index is 32.09 kg/m.  Physical Exam Vitals reviewed.  Constitutional:      Appearance: Normal appearance. He is obese.  HENT:     Head: Normocephalic and atraumatic.     Nose: Nose normal.     Comments: No visible polyps  Neck:     Vascular: No carotid bruit.  Cardiovascular:     Rate and Rhythm: Normal rate and regular rhythm.     Heart sounds: Normal heart sounds.  Pulmonary:     Effort: Pulmonary effort is normal.     Breath sounds: Normal breath sounds.  Abdominal:     General: Abdomen is flat. Bowel sounds are normal.     Palpations: Abdomen is soft.  Musculoskeletal:     Cervical back: Normal range of motion and neck supple.     Comments: ttp over right greater trochanteric process  Skin:    Capillary Refill: Capillary refill takes less than 2 seconds.     Comments: Left ring finger with wedding ring stuck due to swollen PIP joint. On constriction or ischemia present.   Neurological:     General: No focal deficit present.     Mental Status: He is alert and oriented to person, place, and time.  Psychiatric:        Mood and Affect: Mood normal.        Behavior: Behavior normal.      Office Visit from 07/28/2020 in [facility]  PHQ-2 Total Score 0     Diabetic Foot Exam - Simple   Simple Foot Form Diabetic Foot exam was performed with the following findings: Yes 07/28/2020 10:47 AM  Visual Inspection Sensation Testing Pulse Check Comments       Assessment/plan: 1. Diabetes mellitus without complication ([place]) -continue trulicity and metformin. Overdue for a1c and will adjust medication as needed.  -utd on vaccines -foot exam today -on ACE-I and statin -has had eye exam -encouraged exercise as tolerated.  -f/u in 6 months in a1c to goal   - Hemoglobin A1c; Future - Hemoglobin A1c  2. Essential hypertension Blood pressure is to goal. Continue current anti-hypertensive  medication-zestoretic 20-12.5mg  bid. Refills not given and routine lab work will be done today. Recommended routine exercise and healthy diet including DASH diet and mediterranean diet. Encouraged weight loss. F/u in 6 months.   - CBC with Differential/Platelet; Future - COMPLETE METABOLIC PANEL WITH GFR; Future - COMPLETE METABOLIC PANEL WITH GFR - CBC with Differential/Platelet  3. Right hip pain Discussed may be bursitis. Will check hip xray first, start voltaren gel and will send to sports medicine if xray is normal.  - DG Hip Unilat W OR W/O Pelvis 2-3 Views Right; Future  4. Olfactory hallucination Discussed this may be transient. Has not been going on very long. I want him to give this more time and watch for other symptoms. Discussed could be as simple as age related or more serious like neurological disease. No concerns of this at this time. I do what him to start flonase and see if this helps for polyps etc. Will f/u on this at next visit or he can email me if gets worse.   5. Constrictive jewelry  of finger, initial encounter Non emergent. Just can't get off his finger. I do not have ring cutters at this time. Will look into this or see about bringing him back vs. Trying string method or the glove method to see if we can keep ring intact.      This visit occurred during the SARS-CoV-2 public health emergency.  Safety protocols were in place, including screening questions prior to the visit, additional usage of staff PPE, and extensive cleaning of exam room while observing appropriate contact time as indicated for disinfecting solutions.     Return in about 6 months (around 01/25/2021) for diabetes/htn/cholesterol: fasting labs. Orma Flaming, MD [place]   07/28/2020

## 2020-07-29 ENCOUNTER — Encounter: Payer: Self-pay | Admitting: Family Medicine

## 2020-07-29 LAB — CBC WITH DIFFERENTIAL/PLATELET
Absolute Monocytes: 511 cells/uL (ref 200–950)
Basophils Absolute: 41 cells/uL (ref 0–200)
Basophils Relative: 0.6 %
Eosinophils Absolute: 83 cells/uL (ref 15–500)
Eosinophils Relative: 1.2 %
HCT: 40.9 % (ref 38.5–50.0)
Hemoglobin: 13.8 g/dL (ref 13.2–17.1)
Lymphs Abs: 559 cells/uL — ABNORMAL LOW (ref 850–3900)
MCH: 30.4 pg (ref 27.0–33.0)
MCHC: 33.7 g/dL (ref 32.0–36.0)
MCV: 90.1 fL (ref 80.0–100.0)
MPV: 11 fL (ref 7.5–12.5)
Monocytes Relative: 7.4 %
Neutro Abs: 5706 cells/uL (ref 1500–7800)
Neutrophils Relative %: 82.7 %
Platelets: 392 10*3/uL (ref 140–400)
RBC: 4.54 10*6/uL (ref 4.20–5.80)
RDW: 12.9 % (ref 11.0–15.0)
Total Lymphocyte: 8.1 %
WBC: 6.9 10*3/uL (ref 3.8–10.8)

## 2020-07-29 LAB — COMPLETE METABOLIC PANEL WITH GFR
AG Ratio: 1.6 (calc) (ref 1.0–2.5)
ALT: 16 U/L (ref 9–46)
AST: 17 U/L (ref 10–35)
Albumin: 4.5 g/dL (ref 3.6–5.1)
Alkaline phosphatase (APISO): 88 U/L (ref 35–144)
BUN: 23 mg/dL (ref 7–25)
CO2: 28 mmol/L (ref 20–32)
Calcium: 10.1 mg/dL (ref 8.6–10.3)
Chloride: 99 mmol/L (ref 98–110)
Creat: 1.11 mg/dL (ref 0.70–1.25)
GFR, Est African American: 80 mL/min/{1.73_m2} (ref >=60)
GFR, Est Non African American: 69 mL/min/{1.73_m2} (ref >=60)
Globulin: 2.9 g/dL (calc) (ref 1.9–3.7)
Glucose, Bld: 125 mg/dL — ABNORMAL HIGH (ref 65–99)
Potassium: 4.9 mmol/L (ref 3.5–5.3)
Sodium: 137 mmol/L (ref 135–146)
Total Bilirubin: 0.7 mg/dL (ref 0.2–1.2)
Total Protein: 7.4 g/dL (ref 6.1–8.1)

## 2020-07-29 LAB — HEMOGLOBIN A1C
Hgb A1c MFr Bld: 6.2 % of total Hgb — ABNORMAL HIGH (ref <5.7)
Mean Plasma Glucose: 131 (calc)
eAG (mmol/L): 7.3 (calc)

## 2020-07-29 MED ORDER — METFORMIN HCL 500 MG PO TABS: 500.0000 mg | ORAL_TABLET | Freq: Three times a day (TID) | ORAL | 1 refills | Status: DC

## 2020-08-04 ENCOUNTER — Other Ambulatory Visit: Payer: Self-pay

## 2020-08-04 ENCOUNTER — Telehealth: Payer: Self-pay

## 2020-08-04 ENCOUNTER — Ambulatory Visit (INDEPENDENT_AMBULATORY_CARE_PROVIDER_SITE_OTHER)
Admission: RE | Admit: 2020-08-04 | Discharge: 2020-08-04 | Disposition: A | Discharge status: Home / Self Care | Payer: Medicare Other | Source: Ambulatory Visit | Attending: Family Medicine | Admitting: Family Medicine

## 2020-08-04 DIAGNOSIS — M25551 Pain in right hip: Secondary | ICD-10-CM

## 2020-08-04 NOTE — Telephone Encounter (Signed)
I spoke with pt to schedule him to have his ring cut off. He says that Friday is not a good day, he will be moving and that he will call after Thanksgiving.

## 2020-08-06 ENCOUNTER — Encounter: Payer: Self-pay | Admitting: Family Medicine

## 2020-08-06 NOTE — Telephone Encounter (Signed)
Thank you for this information, I will forward this to the front desk for documentation.  Have a great weekend!

## 2020-08-06 NOTE — Telephone Encounter (Signed)
Demographics have been updated.

## 2020-08-08 ENCOUNTER — Other Ambulatory Visit: Payer: Self-pay | Admitting: Family Medicine

## 2020-08-25 ENCOUNTER — Other Ambulatory Visit: Payer: Self-pay | Admitting: Family Medicine

## 2020-11-08 ENCOUNTER — Encounter: Payer: Self-pay | Admitting: Family Medicine

## 2020-11-08 ENCOUNTER — Other Ambulatory Visit: Payer: Self-pay

## 2020-11-08 ENCOUNTER — Ambulatory Visit (INDEPENDENT_AMBULATORY_CARE_PROVIDER_SITE_OTHER): Payer: Medicare Other

## 2020-11-08 ENCOUNTER — Ambulatory Visit: Payer: Medicare Other | Admitting: Family Medicine

## 2020-11-08 ENCOUNTER — Ambulatory Visit (INDEPENDENT_AMBULATORY_CARE_PROVIDER_SITE_OTHER): Payer: Medicare Other | Admitting: Family Medicine

## 2020-11-08 VITALS — BP 110/80 | HR 101 | Temp 97.4°F | Ht 73.0 in | Wt 220.0 lb

## 2020-11-08 DIAGNOSIS — E119 Type 2 diabetes mellitus without complications: Secondary | ICD-10-CM

## 2020-11-08 DIAGNOSIS — C775 Secondary and unspecified malignant neoplasm of intrapelvic lymph nodes: Secondary | ICD-10-CM

## 2020-11-08 DIAGNOSIS — C61 Malignant neoplasm of prostate: Secondary | ICD-10-CM

## 2020-11-08 DIAGNOSIS — R5383 Other fatigue: Secondary | ICD-10-CM

## 2020-11-08 DIAGNOSIS — R110 Nausea: Secondary | ICD-10-CM | POA: Diagnosis not present

## 2020-11-08 DIAGNOSIS — M2550 Pain in unspecified joint: Secondary | ICD-10-CM

## 2020-11-08 DIAGNOSIS — R1013 Epigastric pain: Secondary | ICD-10-CM

## 2020-11-08 MED ORDER — PANTOPRAZOLE SODIUM 40 MG PO TBEC: 40.0000 mg | DELAYED_RELEASE_TABLET | Freq: Every day | ORAL | 1 refills | Status: AC

## 2020-11-08 NOTE — Progress Notes (Addendum)
Patient: Theodore Foley MRN: [id number] DOB: 12-Oct-1955 PCP: Orma Flaming, MD     Subjective:  Chief Complaint  Patient presents with   Muscle Pain   Nausea    He says that he is hungry, he just cannot eat.   Fatigue    Pt says that he feels like he can sleep all day. Therefore he is not getting enough sleep at night.    HPI: The patient is a 65 y.o. male who presents today for multiple complaints. He started to feel bad about 3 weeks ago. He had been to [place] to see his daughter and when he got back he started to feel bad. He has fatigue, nausea and myalgia. He has lost 22 pounds in one month. He has known prostate cancer metastatic to lymph nodes. He is very unsteady on his feet and is needing a cane to walk. He has pain in his muscles and is just tired. He also can't eat. He gets a choking sensation in his chest and some burning. He gets tightness at the bottom of his esophagus. Associated with food and liquid at times. He has a hard time getting down coffee b/c it hurts. He drank a lot of coffee before this. He has nausea, no vomiting. He does have stomach cramping all over. He has no diarrhea. BM every 3 days, no blood in it. He has chills 24/7, no night sweats or recorded fevers. He has tightness in his chest, but gets shortness of breath much quicker.   -he is on omeprazole that he has been on daily. No travel outside of the country or well water. He also takes extra strength tums that helps. He does have well water. He has no history of h.pylori in the past. He is stressed. He does not use NSAIDS on a regular basis. He typically will have a glass of wine nightly, but since this started he can hardly drink. He is now drinking about  1 glass every 3 days.    -joint pain He has pain in his hip/knees that is the worst. No time difference as to when it's worse. Moving around makes it better.   Diabetes -weight loss and not eating. Denies any hypo events. Fasting AM blood sugars are  100-115. Has continued his medication.   xtandi was stopped about 2 weeks ago.      Review of Systems  Constitutional: Positive for appetite change, chills, fatigue and unexpected weight change.  Respiratory: Positive for shortness of breath. Negative for cough, chest tightness and wheezing.   Cardiovascular: Negative for chest pain, palpitations and leg swelling.  Gastrointestinal: Positive for abdominal pain and nausea. Negative for blood in stool, diarrhea and vomiting.  Genitourinary: Negative for difficulty urinating.  Musculoskeletal: Positive for arthralgias.  Skin: Positive for pallor.  Neurological: Positive for weakness. Negative for facial asymmetry, speech difficulty, light-headedness, numbness and headaches.    Allergies Patient has No Known Allergies.  Past Medical History Patient  has a past medical history of Anxiety, Cancer ([place]), Colon polyps, Depression, Diabetes mellitus without complication ([place]), GERD (gastroesophageal reflux disease), Hyperlipidemia, Hypertension, Sleep apnea, and Urine incontinence.  Surgical History Patient  has a past surgical history that includes Prostatectomy (2017); Hemicolectomy (Right, 2012); Appendectomy (2012); and Colonoscopy.  Family History Pateint's family history includes Alcohol abuse in his sister; Cancer in his brother and maternal grandfather; Depression in his brother, daughter, daughter, daughter, father, paternal grandmother, son, and son; Diabetes in his brother; Early death in his brother; Hearing loss  in his brother; Heart attack in his brother, mother, and paternal grandfather; Heart disease in his brother, maternal grandmother, mother, and paternal grandfather; Hyperlipidemia in his brother and father; Hypertension in his brother, brother, father, and maternal grandmother; Miscarriages / [place] in his mother; Pancreatic cancer in his brother.  Social History Patient  reports that he has quit smoking. He has never  used smokeless tobacco. He reports current alcohol use of about 6.0 standard drinks of alcohol per week. He reports that he does not use drugs.    Objective: Vitals:   11/08/20 1448  BP: 110/80  Pulse: (!) 101  Temp: (!) 97.4 F (36.3 C)  TempSrc: Temporal  SpO2: 95%  Weight: 220 lb (99.8 kg)  Height: 6\' 1"  (1.854 m)    Body mass index is 29.03 kg/m.  Physical Exam Vitals reviewed.  Constitutional:      Appearance: Normal appearance. He is normal weight.     Comments: Appears weak   HENT:     Head: Normocephalic and atraumatic.     Right Ear: Tympanic membrane, ear canal and external ear normal.     Left Ear: Tympanic membrane, ear canal and external ear normal.     Nose: Nose normal.     Mouth/Throat:     Mouth: Mucous membranes are moist.  Eyes:     Extraocular Movements: Extraocular movements intact.     Pupils: Pupils are equal, round, and reactive to light.     Comments: Scleral pallor   Neck:     Vascular: No carotid bruit.  Cardiovascular:     Rate and Rhythm: Normal rate and regular rhythm.     Pulses: Normal pulses.     Heart sounds: Normal heart sounds. No murmur heard.   Pulmonary:     Effort: Pulmonary effort is normal.     Breath sounds: Normal breath sounds.  Abdominal:     General: Bowel sounds are normal. There is no distension.     Palpations: Abdomen is soft.     Tenderness: There is abdominal tenderness. There is no guarding or rebound.  Musculoskeletal:        General: No swelling or signs of injury.     Cervical back: Normal range of motion and neck supple.     Right lower leg: No edema.     Left lower leg: No edema.  Lymphadenopathy:     Cervical: No cervical adenopathy.  Skin:    General: Skin is warm.     Capillary Refill: Capillary refill takes less than 2 seconds.     Coloration: Skin is pale.     Findings: No rash.  Neurological:     General: No focal deficit present.     Mental Status: He is alert and oriented to person,  place, and time.     Motor: Weakness present.     Comments: Using cane, new for him.   Psychiatric:        Mood and Affect: Mood normal.        Behavior: Behavior normal.       CXR: no suspicious lesions seen. No pneumonia or acute finding. Official read pending.   Lumbar spine Curved spine. Loss of disc space. Official read pending.   Assessment/plan: 1. Prostate cancer metastatic to intrapelvic lymph node ([place]) Concern for possible metastases with weight loss, chills, back pain.  Checking xrays/labs. May need more extensive imaging.  - DG Chest 2 View; Future - DG Lumbar Spine Complete; Future - PSA  2. Other fatigue -large differential. Anemia (pale, tachycardia, SOB), lack of caffeine and food, possible cancer mets. No hypoglycemia. Checking labs/imaging.  - CBC with Differential/Platelet - Comprehensive metabolic panel - TSH  3. Nausea GERD vs. PUD vs. H.pylori. checking labs and starting him on protonix daily. Already on appropriate diet.   4. Epigastric pain See above.  - H. pylori antibody, IgG - H. pylori breath test - Lipase  5. Diabetes mellitus without complication ([place]) May need to decrease or stop some of his medication due to weight loss and poor PO intake.  - Hemoglobin A1c  6. Arthralgia, unspecified joint  - Rheumatoid factor - ANA, IFA Comprehensive Panel     This visit occurred during the SARS-CoV-2 public health emergency.  Safety protocols were in place, including screening questions prior to the visit, additional usage of staff PPE, and extensive cleaning of exam room while observing appropriate contact time as indicated for disinfecting solutions.     Return if symptoms worsen or fail to improve.   Orma Flaming, MD [place]   11/10/2020

## 2020-11-08 NOTE — Patient Instructions (Addendum)
Vitamin d3: 2000iu/day: I like everyone on this, especially during covid.   For your stomach..  1) stop omeprazole. I sent in a px drug called protonix to take every AM.  Checking for h.pylori and other labs. You are already on right diet. Could stop all alcohol and avoid caffeine.   2) LOTS of labs.   3) xrays.   4) joint pain Labs: Rheumatoid arthritis and ANA also getting checked. Can try voltaren gel if pain is bad and heating pad or ice.   Hopefully we will have some answers when this comes back.

## 2020-11-09 ENCOUNTER — Other Ambulatory Visit: Payer: Self-pay | Admitting: Family Medicine

## 2020-11-09 DIAGNOSIS — N179 Acute kidney failure, unspecified: Secondary | ICD-10-CM

## 2020-11-09 DIAGNOSIS — R5383 Other fatigue: Secondary | ICD-10-CM

## 2020-11-09 LAB — COMPREHENSIVE METABOLIC PANEL
ALT: 16 U/L (ref 0–53)
AST: 19 U/L (ref 0–37)
Albumin: 4.3 g/dL (ref 3.5–5.2)
Alkaline Phosphatase: 84 U/L (ref 39–117)
BUN: 22 mg/dL (ref 6–23)
CO2: 31 mEq/L (ref 19–32)
Calcium: 10 mg/dL (ref 8.4–10.5)
Chloride: 94 mEq/L — ABNORMAL LOW (ref 96–112)
Creatinine, Ser: 1.64 mg/dL — ABNORMAL HIGH (ref 0.40–1.50)
GFR: 43.58 mL/min — ABNORMAL LOW (ref >60.00)
Glucose, Bld: 91 mg/dL (ref 70–99)
Potassium: 4.3 mEq/L (ref 3.5–5.1)
Sodium: 133 mEq/L — ABNORMAL LOW (ref 135–145)
Total Bilirubin: 0.3 mg/dL (ref 0.2–1.2)
Total Protein: 7.8 g/dL (ref 6.0–8.3)

## 2020-11-09 LAB — CBC WITH DIFFERENTIAL/PLATELET
Basophils Absolute: 0.1 10*3/uL (ref 0.0–0.1)
Basophils Relative: 1 % (ref 0.0–3.0)
Eosinophils Absolute: 0.1 10*3/uL (ref 0.0–0.7)
Eosinophils Relative: 1.5 % (ref 0.0–5.0)
HCT: 39.4 % (ref 39.0–52.0)
Hemoglobin: 13.5 g/dL (ref 13.0–17.0)
Lymphocytes Relative: 16.9 % (ref 12.0–46.0)
Lymphs Abs: 0.9 10*3/uL (ref 0.7–4.0)
MCHC: 34.4 g/dL (ref 30.0–36.0)
MCV: 87.2 fl (ref 78.0–100.0)
Monocytes Absolute: 0.4 10*3/uL (ref 0.1–1.0)
Monocytes Relative: 8.1 % (ref 3.0–12.0)
Neutro Abs: 4 10*3/uL (ref 1.4–7.7)
Neutrophils Relative %: 72.5 % (ref 43.0–77.0)
Platelets: 371 10*3/uL (ref 150.0–400.0)
RBC: 4.51 Mil/uL (ref 4.22–5.81)
RDW: 13.2 % (ref 11.5–15.5)
WBC: 5.5 10*3/uL (ref 4.0–10.5)

## 2020-11-09 LAB — LIPASE: Lipase: 41 U/L (ref 11.0–59.0)

## 2020-11-09 LAB — HEMOGLOBIN A1C: Hgb A1c MFr Bld: 6 % (ref 4.6–6.5)

## 2020-11-09 LAB — PSA: PSA: 0.02 ng/mL — ABNORMAL LOW (ref 0.10–4.00)

## 2020-11-09 LAB — H. PYLORI ANTIBODY, IGG: H Pylori IgG: NEGATIVE

## 2020-11-09 LAB — TSH: TSH: 1.67 u[IU]/mL (ref 0.35–4.50)

## 2020-11-10 LAB — ANA, IFA COMPREHENSIVE PANEL
Anti Nuclear Antibody (ANA): POSITIVE — AB
ENA SM Ab Ser-aCnc: <1 AI
SM/RNP: <1 AI
SSA (Ro) (ENA) Antibody, IgG: <1 AI
SSB (La) (ENA) Antibody, IgG: <1 AI
Scleroderma (Scl-70) (ENA) Antibody, IgG: <1 AI
ds DNA Ab: 1 IU/mL

## 2020-11-10 LAB — RHEUMATOID FACTOR: Rheumatoid fact SerPl-aCnc: <14 IU/mL (ref <14)

## 2020-11-10 LAB — ANTI-NUCLEAR AB-TITER (ANA TITER): ANA Titer 1: 1:80 {titer} — ABNORMAL HIGH

## 2020-11-10 LAB — H. PYLORI BREATH TEST: H. pylori Breath Test: NOT DETECTED

## 2020-11-11 ENCOUNTER — Encounter: Payer: Self-pay | Admitting: Family Medicine

## 2020-11-11 DIAGNOSIS — R768 Other specified abnormal immunological findings in serum: Secondary | ICD-10-CM | POA: Insufficient documentation

## 2020-11-11 DIAGNOSIS — R7689 Other specified abnormal immunological findings in serum: Secondary | ICD-10-CM | POA: Insufficient documentation

## 2020-11-13 ENCOUNTER — Encounter: Payer: Self-pay | Admitting: Family Medicine

## 2020-11-15 ENCOUNTER — Encounter: Payer: Self-pay | Admitting: Family Medicine

## 2020-11-17 ENCOUNTER — Other Ambulatory Visit: Payer: Self-pay | Admitting: Family Medicine

## 2020-11-17 ENCOUNTER — Other Ambulatory Visit: Payer: Medicare Other

## 2020-11-17 ENCOUNTER — Telehealth: Payer: Self-pay

## 2020-11-17 ENCOUNTER — Other Ambulatory Visit: Payer: Self-pay

## 2020-11-17 ENCOUNTER — Ambulatory Visit (HOSPITAL_COMMUNITY)
Admission: RE | Admit: 2020-11-17 | Discharge: 2020-11-17 | Disposition: A | Discharge status: Home / Self Care | Payer: Medicare Other | Source: Ambulatory Visit | Attending: Family Medicine | Admitting: Family Medicine

## 2020-11-17 DIAGNOSIS — C775 Secondary and unspecified malignant neoplasm of intrapelvic lymph nodes: Secondary | ICD-10-CM

## 2020-11-17 DIAGNOSIS — R634 Abnormal weight loss: Secondary | ICD-10-CM

## 2020-11-17 DIAGNOSIS — I499 Cardiac arrhythmia, unspecified: Secondary | ICD-10-CM

## 2020-11-17 DIAGNOSIS — C61 Malignant neoplasm of prostate: Secondary | ICD-10-CM

## 2020-11-17 LAB — POCT I-STAT CREATININE: Creatinine, Ser: 2.1 mg/dL — ABNORMAL HIGH (ref 0.61–1.24)

## 2020-11-17 MED ORDER — IOHEXOL 9 MG/ML PO SOLN
1000.0000 mL | ORAL | Status: AC
Start: 2020-11-17 — End: 2020-11-17

## 2020-11-17 NOTE — Telephone Encounter (Signed)
Verbal order given.  Orma Flaming, MD [place]

## 2020-11-17 NOTE — Telephone Encounter (Signed)
Theodore Foley from [facility] needs verbal order for iSTAT Creatinine. Dr. Rogers Blocker said it was okay to do a verbal order. Please call and provider order. [phone]. Pts CT is at 4pm today.

## 2020-11-18 ENCOUNTER — Encounter: Payer: Self-pay | Admitting: Internal Medicine

## 2020-11-18 ENCOUNTER — Ambulatory Visit: Payer: Medicare Other

## 2020-11-18 ENCOUNTER — Other Ambulatory Visit: Payer: Self-pay | Admitting: Internal Medicine

## 2020-11-18 ENCOUNTER — Ambulatory Visit (INDEPENDENT_AMBULATORY_CARE_PROVIDER_SITE_OTHER): Payer: Medicare Other | Admitting: Internal Medicine

## 2020-11-18 VITALS — BP 124/90 | HR 100 | Ht 73.0 in | Wt 221.6 lb

## 2020-11-18 DIAGNOSIS — R002 Palpitations: Secondary | ICD-10-CM

## 2020-11-18 DIAGNOSIS — R0609 Other forms of dyspnea: Secondary | ICD-10-CM

## 2020-11-18 DIAGNOSIS — C61 Malignant neoplasm of prostate: Secondary | ICD-10-CM

## 2020-11-18 DIAGNOSIS — R0600 Dyspnea, unspecified: Secondary | ICD-10-CM

## 2020-11-18 NOTE — Progress Notes (Signed)
Cardiology Office Note:    Date:  11/18/2020   ID:  Theodore Foley, DOB Oct 14, 1955, MRN [id number]  PCP:  Orma Flaming, MD  Cardiologist:  No primary care provider on file.  Electrophysiologist:  None   Referring MD: Orma Flaming, MD   Chief Complaint/Reason for Referral: Palpitations - "fluttering", dyspnea on exertion  History of Present Illness:    Theodore Foley is a 65 y.o. male is a very pleasant 65 yo male who presents today with two of his daughters, [name] who is a Marketing executive with [employer] and Dr. Buford Dresser, a cardiologist in our group.  [name] past medical history includes hypertension, hyperlipidemia, OSA not currently on BiPAP, diabetes mellitus, prior 40-pack-year smoking history, and prostate cancer as noted below.  [name] presents today for 6 to 8 weeks of significant fatigue, malaise, greater than 20 pound weight loss in 1 month, and dyspnea on exertion.  He has a past medical history of prostate cancer with prostatectomy in 2017 and oral antiandrogen therapy until recently.  Due to significant fatigue, he reviewed his medications and his oral antiandrogen therapy had known significant fatigue as a side effect.  He stopped this medication with the direction of his urologist but instead of noticing improvement in fatigue this began to get worse.  He saw his primary care physician approximately 10 days ago and concern was raised for possible progression of malignancy.  Cross-sectional imaging was planned.  CT abdomen pelvis performed 11/17/2020 showed new and enlarged pelvic lymphadenopathy and periaortic retroperitoneal adenopathy in a pattern typical of metastatic prostate cancer lymphadenopathy.  There were also new bilateral lower lobe round pulmonary nodules of varying size in a pattern typical of pulmonary metastases.  No skeletal metastases were identified.  Findings strongly suggestive of metastatic prostate cancer.  I have independently reviewed these images.   This was not a gated or cardiac focus study, however there are coronary artery calcifications in the posterior circulation, I suspect in the PDA and perhaps in the distal RCA.  He underwent coronary angiography in 2018 at [facility] for exertional chest pain and dyspnea on exertion.  He underwent coronary angiography as a result of abnormal stress testing.  Per Dr. Judeth Cornfield report, only minimal CAD was seen, with a 20% lesion of unknown anatomic location.  He experiences fluttering in his chest on a daily basis.  I note that on EKG he has a PVC and a PAC today.  His daughter has a Kardia device and we reviewed the single-lead ECG strip together.  Irregular rhythm with no discernible atrial activity is noted, and report suggest possible atrial fibrillation.  This would be a new diagnosis of A. fib for [name].  He does have sleep apnea but is not currently using his BiPAP machine.  He was previously taking a beta-blocker but stopped due to fatigue and hypotension.  He does not have significant PND or orthopnea.  Does not have significant leg swelling.  Denies current syncope or prominent presyncope.  Past Medical History:  Diagnosis Date  . Anxiety   . Cancer [facility])    prostate cancer-prostatectomy in 2017  . Colon polyps   . Depression   . Diabetes mellitus without complication ([place])   . GERD (gastroesophageal reflux disease)   . Hyperlipidemia   . Hypertension   . Sleep apnea    no cpap at present  . Urine incontinence     Past Surgical History:  Procedure Laterality Date  . APPENDECTOMY  2012  .  COLONOSCOPY    . HEMICOLECTOMY Right 2012  . PROSTATECTOMY  2017    Current Medications: Current Meds  Medication Sig  . ACCU-CHEK AVIVA PLUS test strip USE 2 (TWO) TIMES DAILY AS DIRECTED FOR HYPERGLYCEMIA  . aspirin 81 MG chewable tablet Chew by mouth.  Marland Kitchen buPROPion (WELLBUTRIN SR) 200 MG 12 hr tablet Take 1 tablet (200 mg total) by mouth 2 (two) times daily.  .  cholecalciferol (VITAMIN D3) 25 MCG (1000 UNIT) tablet Take 1,000 Units by mouth daily.  Marland Kitchen lisinopril-hydrochlorothiazide (ZESTORETIC) 20-12.5 MG tablet TAKE 2 TABLETS BY MOUTH EVERY DAY  . montelukast (SINGULAIR) 10 MG tablet TAKE 1 TABLET BY MOUTH EVERYDAY AT BEDTIME  . oxybutynin (DITROPAN-XL) 5 MG 24 hr tablet TAKE 1 TABLET BY MOUTH EVERYDAY AT BEDTIME  . pantoprazole (PROTONIX) 40 MG tablet Take 1 tablet (40 mg total) by mouth daily.  . rosuvastatin (CRESTOR) 20 MG tablet TAKE 1 TABLET BY MOUTH EVERY DAY  . TRULICITY 1.5 CV/8.9FY SOPN INJECT 1.5MG  INTO THE SKIN ONCE A WEEK  . Vitamins-Lipotropics (VITA-PLUS G) CAPS Take by mouth.     Allergies:   Patient has no known allergies.   Social History   Tobacco Use  . Smoking status: Former Research scientist (life sciences)  . Smokeless tobacco: Never Used  . Tobacco comment: quit 2015  Vaping Use  . Vaping Use: Never used  Substance Use Topics  . Alcohol use: Yes    Alcohol/week: 6.0 standard drinks    Types: 6 Cans of beer per week  . Drug use: Never     Family History: The patient's family history includes Alcohol abuse in his sister; Cancer in his brother and maternal grandfather; Depression in his brother, daughter, daughter, daughter, father, paternal grandmother, son, and son; Diabetes in his brother; Foley death in his brother; Hearing loss in his brother; Heart attack in his brother, mother, and paternal grandfather; Heart disease in his brother, maternal grandmother, mother, and paternal grandfather; Hyperlipidemia in his brother and father; Hypertension in his brother, brother, father, and maternal grandmother; Miscarriages / [place] in his mother; Pancreatic cancer in his brother. There is no history of Colon cancer, Colon polyps, Esophageal cancer, Stomach cancer, or Rectal cancer.  ROS:   Please see the history of present illness.    All other systems reviewed and are negative.  EKGs/Labs/Other Studies Reviewed:    The following studies were  reviewed today:  EKG: Sinus rhythm with PVC and PAC.  Nonspecific ST abnormality most clearly seen in the anterolateral leads.  Borderline QTC 492 ms  I have independently reviewed the images from CT abdomen pelvis 11/17/2020.  Recent Labs: 11/08/2020: ALT 16; BUN 22; Hemoglobin 13.5; Platelets 371.0; Potassium 4.3; Sodium 133; TSH 1.67 11/17/2020: Creatinine, Ser 2.10  Recent Lipid Panel    Component Value Date/Time   CHOL 115 11/17/2019 1403   TRIG 234.0 (H) 11/17/2019 1403   HDL 41.30 11/17/2019 1403   CHOLHDL 3 11/17/2019 1403   VLDL 46.8 (H) 11/17/2019 1403   LDLDIRECT 52.0 11/17/2019 1403    Physical Exam:    VS:  BP 124/90   Pulse 100   Ht 6\' 1"  (1.854 m)   Wt 221 lb 9.6 oz (100.5 kg)   SpO2 97%   BMI 29.24 kg/m     Wt Readings from Last 5 Encounters:  11/18/20 221 lb 9.6 oz (100.5 kg)  11/08/20 220 lb (99.8 kg)  07/28/20 243 lb 3.2 oz (110.3 kg)  11/05/19 246 lb (111.6 kg)  07/11/19 228  lb 9.6 oz (103.7 kg)    Constitutional: No acute distress Eyes: sclera non-icteric, normal conjunctiva and lids ENMT: normal dentition, moist mucous membranes Cardiovascular: Irregular rhythm, normal rate, no murmurs. S1 and S2 normal. Radial pulses normal bilaterally. No jugular venous distention.  Respiratory: clear to auscultation bilaterally GI : normal bowel sounds, soft and nontender. No distention.   MSK: extremities warm, well perfused. No edema.  NEURO: grossly nonfocal exam, moves all extremities. PSYCH: alert and oriented x 3, normal mood and affect.   ASSESSMENT:    1. Prostate cancer ([place])   2. Palpitations   3. Dyspnea on exertion    PLAN:    Prostate cancer ([place]) - Plan: EKG 12-Lead, ECHOCARDIOGRAM COMPLETE Palpitations - Plan: EKG 12-Lead, ECHOCARDIOGRAM COMPLETE Dyspnea on exertion - Plan: ECHOCARDIOGRAM COMPLETE, CANCELED: LONG TERM MONITOR (3-14 DAYS) -Given class II-III dyspnea, I performed a bedside echocardiogram to exclude severe biventricular  dysfunction in the office today.  Limited windows, but no pericardial effusion is seen, LV function is grossly normal.  RV function is mildly dilated with normal function and this may be secondary to his untreated OSA.  IVC is normal size and collapsible with respiration and sniff, suggesting right atrial pressure of 3 mmHg.  I would like to obtain a formal echocardiogram to evaluate diastolic function, I feel somewhat more confident that systolic function is normal after bedside echo.  I recommend strain, 3D EF, and Definity contrast on echo. -Patient had a normal coronary angiogram in 2018.  I do see coronary artery calcifications in the posterior circulation PDA.  Dominance is difficult to assess given lack of bleeding and contrast.  I discussed this in detail with the patient and his 2 daughters.  We will defer stress testing at this time given that it would be quite unlikely to have severe proximal disease develop in less than 4 years.  We will revisit if symptoms persist. -For palpitations we will need to exclude atrial fibrillation for further risk stratification.  This may also be pertinent to treatment strategies going forward per urology.  Will review burden of ectopy and if atrial fibrillation is present and review treatment recommendations based on monitor results.  For now, no change to medication therapy required.  Total time of encounter: 45 minutes total time of encounter, including 30 minutes spent in face-to-face patient care on the date of this encounter. This time includes coordination of care and counseling regarding above mentioned problem list. Remainder of non-face-to-face time involved reviewing chart documents/testing relevant to the patient encounter and documentation in the medical record. I have independently reviewed documentation from referring provider.   Cherlynn Kaiser, MD [place]  [facility]    Medication Adjustments/Labs and Tests Ordered: Current medicines  are reviewed at length with the patient today.  Concerns regarding medicines are outlined above.   Orders Placed This Encounter  Procedures  . EKG 12-Lead  . ECHOCARDIOGRAM COMPLETE      No orders of the defined types were placed in this encounter.   Patient Instructions  Medication Instructions:  No Changes In Medications at this time.  *If you need a refill on your cardiac medications before your next appointment, please call your pharmacy*  Testing/Procedures: Your physician has requested that you have an echocardiogram. Echocardiography is a painless test that uses sound waves to create images of your heart. It provides your doctor with information about the size and shape of your heart and how well your heart's chambers and valves are working. You  may receive an ultrasound enhancing agent through an IV if needed to better visualize your heart during the echo.This procedure takes approximately one hour. There are no restrictions for this procedure.   ZIO XT- Long Term Monitor Instructions   Your physician has requested you wear your ZIO patch monitor 14 days.   This is a single patch monitor.  [employer] supplies one patch monitor per enrollment.  Additional stickers are not available.   Please do not apply patch if you will be having a Nuclear Stress Test, Echocardiogram, Cardiac CT, MRI, or Chest Xray during the time frame you would be wearing the monitor. The patch cannot be worn during these tests.  You cannot remove and re-apply the ZIO XT patch monitor.   Your ZIO patch monitor will be sent USPS Priority mail from [facility] directly to your home address. The monitor may also be mailed to a PO BOX if home delivery is not available.   It may take 3-5 days to receive your monitor after you have been enrolled.   Once you have received you monitor, please review enclosed instructions.  Your monitor has already been registered assigning a specific monitor serial # to you.    Applying the monitor   Shave hair from upper left chest.   Hold abrader disc by orange tab.  Rub abrader in 40 strokes over left upper chest as indicated in your monitor instructions.   Clean area with 4 enclosed alcohol pads .  Use all pads to assure are is cleaned thoroughly.  Let dry.   Apply patch as indicated in monitor instructions.  Patch will be place under collarbone on left side of chest with arrow pointing upward.   Rub patch adhesive wings for 2 minutes.Remove white label marked "1".  Remove white label marked "2".  Rub patch adhesive wings for 2 additional minutes.   While looking in a mirror, press and release button in center of patch.  A small green light will flash 3-4 times .  This will be your only indicator the monitor has been turned on.     Do not shower for the first 24 hours.  You may shower after the first 24 hours.   Press button if you feel a symptom. You will hear a small click.  Record Date, Time and Symptom in the Patient Log Book.   When you are ready to remove patch, follow instructions on last 2 pages of Patient Log Book.  Stick patch monitor onto last page of Patient Log Book.   Place Patient Log Book in [place] box.  Use locking tab on box and tape box closed securely.  The [employer] and [employer] has [employer] on it.  Please place in mailbox as soon as possible.  Your physician should have your test results approximately 7 days after the monitor has been mailed back to [facility].   Call [facility] at [phone] if you have questions regarding your ZIO XT patch monitor.  Call them immediately if you see an orange light blinking on your monitor.   If your monitor falls off in less than 4 days contact our Monitor department at [phone].  If your monitor becomes loose or falls off after 4 days call [employer] at [phone] for suggestions on securing your monitor.   Follow-Up: At [facility], you and your health needs are  our priority.  As part of our continuing mission to provide you with exceptional heart care, we have created  designated Provider Care Teams.  These Care Teams include your primary Cardiologist (physician) and Advanced Practice Providers (APPs -  Physician Assistants and Nurse Practitioners) who all work together to provide you with the care you need, when you need it.  We recommend signing up for the patient portal called "MyChart".  Sign up information is provided on this After Visit Summary.  MyChart is used to connect with patients for Virtual Visits (Telemedicine).  Patients are able to view lab/test results, encounter notes, upcoming appointments, etc.  Non-urgent messages can be sent to your provider as well.   To learn more about what you can do with MyChart, go to [email or web address].    Your next appointment:   AS NEEDED   The format for your next appointment:   In Person  Provider:   Cherlynn Kaiser, MD

## 2020-11-18 NOTE — Patient Instructions (Signed)
Medication Instructions:  No Changes In Medications at this time.  *If you need a refill on your cardiac medications before your next appointment, please call your pharmacy*  Testing/Procedures: Your physician has requested that you have an echocardiogram. Echocardiography is a painless test that uses sound waves to create images of your heart. It provides your doctor with information about the size and shape of your heart and how well your heart's chambers and valves are working. You may receive an ultrasound enhancing agent through an IV if needed to better visualize your heart during the echo.This procedure takes approximately one hour. There are no restrictions for this procedure.   ZIO XT- Long Term Monitor Instructions   Your physician has requested you wear your ZIO patch monitor 14 days.   This is a single patch monitor.  [employer] supplies one patch monitor per enrollment.  Additional stickers are not available.   Please do not apply patch if you will be having a Nuclear Stress Test, Echocardiogram, Cardiac CT, MRI, or Chest Xray during the time frame you would be wearing the monitor. The patch cannot be worn during these tests.  You cannot remove and re-apply the ZIO XT patch monitor.   Your ZIO patch monitor will be sent USPS Priority mail from [facility] directly to your home address. The monitor may also be mailed to a PO BOX if home delivery is not available.   It may take 3-5 days to receive your monitor after you have been enrolled.   Once you have received you monitor, please review enclosed instructions.  Your monitor has already been registered assigning a specific monitor serial # to you.   Applying the monitor   Shave hair from upper left chest.   Hold abrader disc by orange tab.  Rub abrader in 40 strokes over left upper chest as indicated in your monitor instructions.   Clean area with 4 enclosed alcohol pads .  Use all pads to assure are is cleaned thoroughly.   Let dry.   Apply patch as indicated in monitor instructions.  Patch will be place under collarbone on left side of chest with arrow pointing upward.   Rub patch adhesive wings for 2 minutes.Remove white label marked "1".  Remove white label marked "2".  Rub patch adhesive wings for 2 additional minutes.   While looking in a mirror, press and release button in center of patch.  A small green light will flash 3-4 times .  This will be your only indicator the monitor has been turned on.     Do not shower for the first 24 hours.  You may shower after the first 24 hours.   Press button if you feel a symptom. You will hear a small click.  Record Date, Time and Symptom in the Patient Log Book.   When you are ready to remove patch, follow instructions on last 2 pages of Patient Log Book.  Stick patch monitor onto last page of Patient Log Book.   Place Patient Log Book in [place] box.  Use locking tab on box and tape box closed securely.  The [employer] has [employer] on it.  Please place in mailbox as soon as possible.  Your physician should have your test results approximately 7 days after the monitor has been mailed back to [facility].   Call [facility] at [phone] if you have questions regarding your ZIO XT patch monitor.  Call them immediately if you see  an orange light blinking on your monitor.   If your monitor falls off in less than 4 days contact our Monitor department at [phone].  If your monitor becomes loose or falls off after 4 days call [employer] at [phone] for suggestions on securing your monitor.   Follow-Up: At [facility], you and your health needs are our priority.  As part of our continuing mission to provide you with exceptional heart care, we have created designated Provider Care Teams.  These Care Teams include your primary Cardiologist (physician) and Advanced Practice Providers (APPs -  Physician Assistants and Nurse  Practitioners) who all work together to provide you with the care you need, when you need it.  We recommend signing up for the patient portal called "MyChart".  Sign up information is provided on this After Visit Summary.  MyChart is used to connect with patients for Virtual Visits (Telemedicine).  Patients are able to view lab/test results, encounter notes, upcoming appointments, etc.  Non-urgent messages can be sent to your provider as well.   To learn more about what you can do with MyChart, go to [email or web address].    Your next appointment:   AS NEEDED   The format for your next appointment:   In Person  Provider:   Cherlynn Kaiser, MD

## 2020-11-23 ENCOUNTER — Encounter: Payer: Self-pay | Admitting: Family Medicine

## 2020-11-24 ENCOUNTER — Other Ambulatory Visit: Payer: Self-pay | Admitting: Family Medicine

## 2020-11-24 DIAGNOSIS — R1310 Dysphagia, unspecified: Secondary | ICD-10-CM

## 2020-11-24 DIAGNOSIS — N179 Acute kidney failure, unspecified: Secondary | ICD-10-CM

## 2020-11-24 DIAGNOSIS — R202 Paresthesia of skin: Secondary | ICD-10-CM

## 2020-11-24 NOTE — Telephone Encounter (Signed)
FYI, Pt is scheduled for 11/25/2020 @ 2:30.

## 2020-11-25 ENCOUNTER — Encounter: Payer: Self-pay | Admitting: Family Medicine

## 2020-11-25 ENCOUNTER — Ambulatory Visit: Payer: Medicare Other | Admitting: Family Medicine

## 2020-11-26 ENCOUNTER — Encounter: Payer: Self-pay | Admitting: Medical Oncology

## 2020-11-26 ENCOUNTER — Other Ambulatory Visit (INDEPENDENT_AMBULATORY_CARE_PROVIDER_SITE_OTHER): Payer: Medicare Other

## 2020-11-26 ENCOUNTER — Other Ambulatory Visit: Payer: Self-pay

## 2020-11-26 ENCOUNTER — Inpatient Hospital Stay: Payer: Medicare Other | Attending: Oncology | Admitting: Oncology

## 2020-11-26 ENCOUNTER — Encounter (HOSPITAL_COMMUNITY): Payer: Self-pay | Admitting: Radiology

## 2020-11-26 VITALS — BP 153/77 | HR 92 | Temp 97.5°F | Resp 18 | Ht 73.0 in | Wt 226.8 lb

## 2020-11-26 DIAGNOSIS — N179 Acute kidney failure, unspecified: Secondary | ICD-10-CM

## 2020-11-26 DIAGNOSIS — R634 Abnormal weight loss: Secondary | ICD-10-CM | POA: Insufficient documentation

## 2020-11-26 DIAGNOSIS — E119 Type 2 diabetes mellitus without complications: Secondary | ICD-10-CM | POA: Insufficient documentation

## 2020-11-26 DIAGNOSIS — Z87891 Personal history of nicotine dependence: Secondary | ICD-10-CM | POA: Insufficient documentation

## 2020-11-26 DIAGNOSIS — R2681 Unsteadiness on feet: Secondary | ICD-10-CM

## 2020-11-26 DIAGNOSIS — R918 Other nonspecific abnormal finding of lung field: Secondary | ICD-10-CM | POA: Insufficient documentation

## 2020-11-26 DIAGNOSIS — C61 Malignant neoplasm of prostate: Secondary | ICD-10-CM | POA: Insufficient documentation

## 2020-11-26 DIAGNOSIS — J439 Emphysema, unspecified: Secondary | ICD-10-CM | POA: Insufficient documentation

## 2020-11-26 DIAGNOSIS — I10 Essential (primary) hypertension: Secondary | ICD-10-CM | POA: Insufficient documentation

## 2020-11-26 DIAGNOSIS — C775 Secondary and unspecified malignant neoplasm of intrapelvic lymph nodes: Secondary | ICD-10-CM | POA: Diagnosis not present

## 2020-11-26 DIAGNOSIS — Z79899 Other long term (current) drug therapy: Secondary | ICD-10-CM | POA: Insufficient documentation

## 2020-11-26 DIAGNOSIS — Z86010 Personal history of colonic polyps: Secondary | ICD-10-CM | POA: Insufficient documentation

## 2020-11-26 DIAGNOSIS — R202 Paresthesia of skin: Secondary | ICD-10-CM

## 2020-11-26 DIAGNOSIS — J432 Centrilobular emphysema: Secondary | ICD-10-CM | POA: Insufficient documentation

## 2020-11-26 DIAGNOSIS — E785 Hyperlipidemia, unspecified: Secondary | ICD-10-CM | POA: Insufficient documentation

## 2020-11-26 DIAGNOSIS — C786 Secondary malignant neoplasm of retroperitoneum and peritoneum: Secondary | ICD-10-CM | POA: Insufficient documentation

## 2020-11-26 DIAGNOSIS — G4730 Sleep apnea, unspecified: Secondary | ICD-10-CM | POA: Insufficient documentation

## 2020-11-26 DIAGNOSIS — K219 Gastro-esophageal reflux disease without esophagitis: Secondary | ICD-10-CM | POA: Insufficient documentation

## 2020-11-26 LAB — CBC WITH DIFFERENTIAL/PLATELET
Basophils Absolute: 0 10*3/uL (ref 0.0–0.1)
Basophils Relative: 0.5 % (ref 0.0–3.0)
Eosinophils Absolute: 0.1 10*3/uL (ref 0.0–0.7)
Eosinophils Relative: 1.7 % (ref 0.0–5.0)
HCT: 39.4 % (ref 39.0–52.0)
Hemoglobin: 13.6 g/dL (ref 13.0–17.0)
Lymphocytes Relative: 14.2 % (ref 12.0–46.0)
Lymphs Abs: 0.7 10*3/uL (ref 0.7–4.0)
MCHC: 34.6 g/dL (ref 30.0–36.0)
MCV: 88.4 fl (ref 78.0–100.0)
Monocytes Absolute: 0.4 10*3/uL (ref 0.1–1.0)
Monocytes Relative: 7.7 % (ref 3.0–12.0)
Neutro Abs: 3.9 10*3/uL (ref 1.4–7.7)
Neutrophils Relative %: 75.9 % (ref 43.0–77.0)
Platelets: 347 10*3/uL (ref 150.0–400.0)
RBC: 4.45 Mil/uL (ref 4.22–5.81)
RDW: 13 % (ref 11.5–15.5)
WBC: 5.1 10*3/uL (ref 4.0–10.5)

## 2020-11-26 LAB — COMPREHENSIVE METABOLIC PANEL
ALT: 24 U/L (ref 0–53)
AST: 19 U/L (ref 0–37)
Albumin: 4.1 g/dL (ref 3.5–5.2)
Alkaline Phosphatase: 69 U/L (ref 39–117)
BUN: 21 mg/dL (ref 6–23)
CO2: 28 mEq/L (ref 19–32)
Calcium: 9.8 mg/dL (ref 8.4–10.5)
Chloride: 98 mEq/L (ref 96–112)
Creatinine, Ser: 1.34 mg/dL (ref 0.40–1.50)
GFR: 55.52 mL/min — ABNORMAL LOW (ref >60.00)
Glucose, Bld: 139 mg/dL — ABNORMAL HIGH (ref 70–99)
Potassium: 4 mEq/L (ref 3.5–5.1)
Sodium: 136 mEq/L (ref 135–145)
Total Bilirubin: 0.5 mg/dL (ref 0.2–1.2)
Total Protein: 7.3 g/dL (ref 6.0–8.3)

## 2020-11-26 LAB — VITAMIN B12: Vitamin B-12: 420 pg/mL (ref 211–911)

## 2020-11-26 NOTE — Progress Notes (Signed)
Theodore Foley "[name]" Male, 65 y.o., 1956/09/15  MRN:  [id number] Phone:  [phone] [name])       PCP:  Orma Flaming, MD Primary Cvg:  Medicare/Medicare Part A And B  Next Appt With Cardiology 12/03/2020 at 1:00 PM           RE: CT Biopsy Received: Today [name], MD  [name]  Ok   CT core R pelvic LAN   DDH        Previous Messages   ----- Message -----  From: Garth Bigness D  Sent: 11/26/2020 12:32 PM EST  To: Ir Procedure Requests  Subject: CT Biopsy                     Procedure: CT Biopsy   Reason: Prostate cancer metastatic to intrapelvic lymph node, Lymphadenopathy   History: CT in computer   Provider: Wyatt Portela   Provider Contact: [phone]

## 2020-11-26 NOTE — Progress Notes (Signed)
Reason for the request: Prostate cancer  HPI: I was asked by Dr. Tresa Moore to evaluate [name] for the diagnosis of prostate cancer.  Is a 65 year old man diagnosed with prostate cancer in 2017.  At that time out while he was living in [place] was diagnosed with a T3 disease and underwent a robotic prostatectomy in 2017.  He was found to have a Gleason score 9 with positive seminal vesicle and margins.  His postoperative PSA remains undetectable and underwent external beam radiation and 2 years of androgen deprivation.  He subsequently relocated to [facility] and establish care with Dr. Tresa Moore.  In March 2021 he developed bilateral pelvic lymphadenopathy and PSA 5.2.  He was started on Eligard and repeat PSA in June 2021 was 0.49.  Gillermina Phy was started in March 2021.  In October 2021 his PSA was 0.87 and received Eligard 45 mg every 6 months after that.  In January 2022 his PSA remained at 0.36.  In February 2022 he underwent a CT scan which showed a worsening pelvic on retroperitoneal adenopathy and possibly lung metastasis.  His PSA at that time was 0.2.  Laboratory data on February 14 showed a creatinine of 1.64 and normal liver function test.  He had a normal CBC at that time.  His CT scan on 11/17/2020 showed a new rounded subpleural nodule in the right lower lobe measuring 10 mm.  There is a right lobe nodule measuring 6 mm.  A right middle lobe nodule measuring 5 mm.  A right base nodules measuring 5 mm.  His lymphadenopathy was noted to his left common iliac measuring 13 mm, right internal iliac node measuring 16 mm.  The right external iliac lymph node measuring 12 mm.  Large node on the right obturator space measuring 26 mm.  No skeletal metastasis noted.  Based on these findings, he felt that he might have either different malignancy or poorly differentiated prostate cancer.  Clinically, he has reported feeling poorly in the few weeks leading up to his CT scan in February 2022.  He lost weight and  reported overall fatigue, unsteadiness and neuropathy.  This led to his work-up including a CT scan and laboratory testing.  He has felt better this week and is gaining weight back and has resumed Xtandi after an erection for about 3 weeks.  He still lives with his daughters and ambulate with the help of a cane.  He is still able to drive at this time.  He does not report any headaches, blurry vision, syncope or seizures. Does not report any fevers, chills or sweats.  Does not report any cough, wheezing or hemoptysis.  Does not report any chest pain, palpitation, orthopnea or leg edema.  Does not report any nausea, vomiting or abdominal pain.  Does not report any constipation or diarrhea.  Does not report any skeletal complaints.    Does not report frequency, urgency or hematuria.  Does not report any skin rashes or lesions. Does not report any heat or cold intolerance.  Does not report any lymphadenopathy or petechiae.  Does not report any anxiety or depression.  Remaining review of systems is negative.    Past Medical History:  Diagnosis Date  . Anxiety   . Cancer [facility])    prostate cancer-prostatectomy in 2017  . Colon polyps   . Depression   . Diabetes mellitus without complication ([place])   . GERD (gastroesophageal reflux disease)   . Hyperlipidemia   . Hypertension   . Sleep apnea  no cpap at present  . Urine incontinence   :  Past Surgical History:  Procedure Laterality Date  . APPENDECTOMY  2012  . COLONOSCOPY    . HEMICOLECTOMY Right 2012  . PROSTATECTOMY  2017  :   Current Outpatient Medications:  .  ACCU-CHEK AVIVA PLUS test strip, USE 2 (TWO) TIMES DAILY AS DIRECTED FOR HYPERGLYCEMIA, Disp: , Rfl: 21 .  aspirin 81 MG chewable tablet, Chew by mouth., Disp: , Rfl:  .  buPROPion (WELLBUTRIN SR) 200 MG 12 hr tablet, Take 1 tablet (200 mg total) by mouth 2 (two) times daily., Disp: 180 tablet, Rfl: 1 .  cholecalciferol (VITAMIN D3) 25 MCG (1000 UNIT) tablet, Take 1,000 Units  by mouth daily., Disp: , Rfl:  .  lisinopril-hydrochlorothiazide (ZESTORETIC) 20-12.5 MG tablet, TAKE 2 TABLETS BY MOUTH EVERY DAY, Disp: 180 tablet, Rfl: 0 .  montelukast (SINGULAIR) 10 MG tablet, TAKE 1 TABLET BY MOUTH EVERYDAY AT BEDTIME, Disp: 90 tablet, Rfl: 3 .  oxybutynin (DITROPAN-XL) 5 MG 24 hr tablet, TAKE 1 TABLET BY MOUTH EVERYDAY AT BEDTIME, Disp: 90 tablet, Rfl: 3 .  pantoprazole (PROTONIX) 40 MG tablet, Take 1 tablet (40 mg total) by mouth daily., Disp: 90 tablet, Rfl: 1 .  rosuvastatin (CRESTOR) 20 MG tablet, TAKE 1 TABLET BY MOUTH EVERY DAY, Disp: 90 tablet, Rfl: 3 .  TRULICITY 1.5 ME/2.6ST SOPN, INJECT 1.5MG  INTO THE SKIN ONCE A WEEK, Disp: 6 mL, Rfl: 1 .  Vitamins-Lipotropics (VITA-PLUS G) CAPS, Take by mouth., Disp: , Rfl: :  No Known Allergies:  Family History  Problem Relation Age of Onset  . Heart attack Mother   . Heart disease Mother   . Miscarriages / [place] Mother   . Depression Father   . Hyperlipidemia Father   . Hypertension Father   . Alcohol abuse Sister   . Cancer Brother   . Depression Brother   . Early death Brother   . Hearing loss Brother   . Heart attack Brother   . Heart disease Brother   . Hyperlipidemia Brother   . Hypertension Brother   . Depression Daughter   . Depression Son   . Heart disease Maternal Grandmother   . Hypertension Maternal Grandmother   . Cancer Maternal Grandfather   . Depression Paternal Grandmother   . Heart attack Paternal Grandfather   . Heart disease Paternal Grandfather   . Diabetes Brother   . Hypertension Brother   . Pancreatic cancer Brother   . Depression Daughter   . Depression Daughter   . Depression Son   . Colon cancer Neg Hx   . Colon polyps Neg Hx   . Esophageal cancer Neg Hx   . Stomach cancer Neg Hx   . Rectal cancer Neg Hx   :  Social History   Socioeconomic History  . Marital status: Widowed    Spouse name: Not on file  . Number of children: Not on file  . Years of education:  Not on file  . Highest education level: Not on file  Occupational History  . Not on file  Tobacco Use  . Smoking status: Former Research scientist (life sciences)  . Smokeless tobacco: Never Used  . Tobacco comment: quit 2015  Vaping Use  . Vaping Use: Never used  Substance and Sexual Activity  . Alcohol use: Yes    Alcohol/week: 6.0 standard drinks    Types: 6 Cans of beer per week  . Drug use: Never  . Sexual activity: Not on file  Other  Topics Concern  . Not on file  Social History Narrative  . Not on file   Social Determinants of Health   Financial Resource Strain: Not on file  Food Insecurity: Not on file  Transportation Needs: Not on file  Physical Activity: Not on file  Stress: Not on file  Social Connections: Not on file  Intimate Partner Violence: Not on file  :  Pertinent items are noted in HPI.  Exam: Blood pressure (!) 153/77, pulse 92, temperature (!) 97.5 F (36.4 C), temperature source Tympanic, resp. rate 18, height 6\' 1"  (1.854 m), weight 226 lb 12.8 oz (102.9 kg), SpO2 99 %.   ECOG 1 General appearance: alert and cooperative appeared without distress. Head: atraumatic without any abnormalities. Eyes: conjunctivae/corneas clear. PERRL.  Sclera anicteric. Throat: lips, mucosa, and tongue normal; without oral thrush or ulcers. Resp: clear to auscultation bilaterally without rhonchi, wheezes or dullness to percussion. Cardio: regular rate and rhythm, S1, S2 normal, no murmur, click, rub or gallop GI: soft, non-tender; bowel sounds normal; no masses,  no organomegaly Skin: Skin color, texture, turgor normal. No rashes or lesions Lymph nodes: Cervical, supraclavicular, and axillary nodes normal. Neurologic: Grossly normal without any motor, sensory or deep tendon reflexes. Musculoskeletal: No joint deformity or effusion.   CT ABDOMEN PELVIS WO CONTRAST  Result Date: 11/17/2020 CLINICAL DATA:  Weight loss. 23 pounds in 3 weeks. History of prostate cancer. Oral contrast only as  patient's creatinine is elevated. EXAM: CT ABDOMEN AND PELVIS WITHOUT CONTRAST TECHNIQUE: Multidetector CT imaging of the abdomen and pelvis was performed following the standard protocol without IV contrast. COMPARISON:  CT 12/12/2019 FINDINGS: Lower chest: New rounded subpleural nodule in the RIGHT lower lobe measures 10 mm (image 19/4). Round RIGHT lobe nodule measures 6 mm on image number 1. RIGHT middle lobe nodule measures 5 mm on same image. New nodule the lateral RIGHT lung base measuring 5 mm on image 36. Small nodules in the azygoesophageal recess of the RIGHT lower lobe. Hepatobiliary: No focal hepatic lesion on noncontrast exam. Small gallstone noted. Pancreas: Pancreas is normal. No ductal dilatation. No pancreatic inflammation. Spleen: Normal spleen Adrenals/urinary tract: Adrenal glands normal. No obstructive uropathy. Ureters and bladder normal. Stomach/Bowel: Stomach and duodenum normal. Small bowel normal. Partial RIGHT hemicolectomy. Several diverticula of the sigmoid colon without acute inflammation. Rectum normal. Vascular/Lymphatic: Abdominal is normal caliber. There are mildly enlarged periaortic lymph nodes. For example lymph node deep to the IVC measuring 8 mm image 43/2. Lymph node deep to the aorta at same level measures 7 mm. Adenopathy in the pelvis noted which is new from CT 12/12/2019. LEFT common iliac node measures 13 mm image 56. RIGHT internal iliac node measures 16 mm image 71. RIGHT external iliac node measures 12 mm image 65. Large node in the RIGHT operator space measuring 26 mm on image 70. Reproductive: Post prostatectomy. Other: No free fluid. Musculoskeletal: Endplate sclerosis and osteophytosis in the lumbar spine. No clear aggressive osseous lesion. IMPRESSION: 1. New and enlarged pelvic lymphadenopathy and periaortic retroperitoneal adenopathy is a pattern typical of metastatic prostate cancer lymphadenopathy. 2. New bilateral lower lobe round pulmonary nodules of varying  size is a pattern typical of pulmonary metastasis. 3. No skeletal metastasis identified. Electronically Signed   By: Suzy Bouchard M.D.   On: 11/17/2020 18:42   DG Chest 2 View  Result Date: 11/09/2020 CLINICAL DATA:  Weight loss. EXAM: CHEST - 2 VIEW COMPARISON:  None. FINDINGS: The heart size and mediastinal contours are within normal  limits. Both lungs are clear. The visualized skeletal structures are unremarkable. IMPRESSION: No active cardiopulmonary disease. Electronically Signed   By: Marijo Conception M.D.   On: 11/09/2020 15:21   DG Lumbar Spine Complete  Result Date: 11/09/2020 CLINICAL DATA:  Weight loss, prostate cancer. EXAM: LUMBAR SPINE - COMPLETE 4+ VIEW COMPARISON:  None. FINDINGS: Moderate levoscoliosis of upper lumbar spine is noted. No fracture or spondylolisthesis is noted. Moderate to severe degenerative disc disease is seen at all levels of the lumbar spine. IMPRESSION: Moderate to severe multilevel degenerative disc disease. No acute abnormality seen in the lumbar spine. Electronically Signed   By: Marijo Conception M.D.   On: 11/09/2020 15:22    Assessment and Plan:   65 year old man with:   1. Prostate cancer diagnosed in 2017.  He was found to have Gleason score of 9 and underwent a prostatectomy found to have a T3 disease with pretreatment PSA of 16.13.  His radical prostatectomy on September 2017 showed positive margins and invasion of the seminal vesicles.  His postop PSA was 12.08.  He was treated with hormone radiation therapy utilizing IMRT for a total of 7000 cGy completed in March 2018 in addition to 2 years of androgen deprivation completed in 2019.  He developed relapsed disease on 2021 and was started on androgen deprivation therapy and Xtandi.  CT scan in February 2022 showed enlarging lymphadenopathy despite low PSA of 0.2 compared to CT scan obtained in March 2021.   The natural course of this disease was discussed at this time and differential diagnosis of  these findings were reviewed.  Poorly differentiated prostate cancer with variant histology could be responsible for the enlarging lymphadenopathy and pulmonary nodules despite a low PSA and maximal suppression of his androgen receptor with androgen deprivation therapy and androgen receptor blockade with Xtandi.  Other possibility includes a different malignancy which is considered less likely.  Management options at this time were reviewed and the critical step would be obtaining tissue biopsy at this time.  I favor obtaining a biopsy of the enlarging lymph nodes to further clarify the etiology of his malignancy and to obtain further molecular testing for better treatment options.  Switching to systemic chemotherapy is very likely with taxane-based chemotherapy versus taxane and platinum doublet versus platinum and etoposide regimen pending the final pathology if it harbors a small cell predominant feature.  For the time being I recommended continuing Xtandi with androgen deprivation.  Once the biopsy is completed we will assess the next step of treatment.   2.  Androgen deprivation: I recommended continued lifetime Eligard treatment which she is currently receiving.  3.  Bone directed therapy: He is currently on calcium and vitamin D supplements.  We will continue to have repeat DEXA scan in the future.  4.  CMS metastasis screening: He is reporting some neurological symptoms at this time will obtain MRI of the brain to rule out metastatic disease given his possible disease transformation.  5.  Follow-up: will be in the near future after obtaining tissue biopsy.   60  minutes were dedicated to this visit. The time was spent on reviewing laboratory data, imaging studies, discussing treatment options, discussing differential diagnosis and answering questions regarding future plan.     A copy of this consult has been forwarded to the requesting physician.

## 2020-11-29 ENCOUNTER — Encounter: Payer: Self-pay | Admitting: Family Medicine

## 2020-11-29 ENCOUNTER — Telehealth: Payer: Self-pay | Admitting: *Deleted

## 2020-11-29 NOTE — Telephone Encounter (Signed)
Theodore Foley is requesting to have a portacath placed on 3/17 while he is having the CT Biopsy.

## 2020-11-30 ENCOUNTER — Other Ambulatory Visit: Payer: Self-pay | Admitting: Oncology

## 2020-11-30 DIAGNOSIS — C61 Malignant neoplasm of prostate: Secondary | ICD-10-CM

## 2020-11-30 MED ORDER — GABAPENTIN 300 MG PO CAPS: 300.0000 mg | ORAL_CAPSULE | Freq: Three times a day (TID) | ORAL | 3 refills | Status: AC

## 2020-12-03 ENCOUNTER — Other Ambulatory Visit: Payer: Self-pay

## 2020-12-03 ENCOUNTER — Ambulatory Visit (HOSPITAL_COMMUNITY)
Admission: RE | Admit: 2020-12-03 | Discharge: 2020-12-03 | Disposition: A | Discharge status: Home / Self Care | Payer: Medicare Other | Source: Ambulatory Visit | Attending: Internal Medicine | Admitting: Internal Medicine

## 2020-12-03 DIAGNOSIS — C61 Malignant neoplasm of prostate: Secondary | ICD-10-CM

## 2020-12-03 DIAGNOSIS — R9431 Abnormal electrocardiogram [ECG] [EKG]: Secondary | ICD-10-CM | POA: Insufficient documentation

## 2020-12-03 DIAGNOSIS — E785 Hyperlipidemia, unspecified: Secondary | ICD-10-CM | POA: Diagnosis not present

## 2020-12-03 DIAGNOSIS — I119 Hypertensive heart disease without heart failure: Secondary | ICD-10-CM | POA: Diagnosis not present

## 2020-12-03 DIAGNOSIS — E119 Type 2 diabetes mellitus without complications: Secondary | ICD-10-CM | POA: Insufficient documentation

## 2020-12-03 DIAGNOSIS — R0609 Other forms of dyspnea: Secondary | ICD-10-CM

## 2020-12-03 DIAGNOSIS — R0600 Dyspnea, unspecified: Secondary | ICD-10-CM | POA: Diagnosis not present

## 2020-12-03 DIAGNOSIS — R002 Palpitations: Secondary | ICD-10-CM | POA: Diagnosis not present

## 2020-12-03 LAB — ECHOCARDIOGRAM COMPLETE: S' Lateral: 2.6 cm

## 2020-12-03 NOTE — Progress Notes (Signed)
  Echocardiogram 2D Echocardiogram has been performed.  Theodore Foley 12/03/2020, 1:36 PM

## 2020-12-06 ENCOUNTER — Ambulatory Visit (HOSPITAL_COMMUNITY)
Admission: RE | Admit: 2020-12-06 | Discharge: 2020-12-06 | Disposition: A | Discharge status: Home / Self Care | Payer: Medicare Other | Source: Ambulatory Visit | Attending: Oncology | Admitting: Oncology

## 2020-12-06 ENCOUNTER — Encounter (HOSPITAL_COMMUNITY): Payer: Self-pay

## 2020-12-06 ENCOUNTER — Other Ambulatory Visit: Payer: Self-pay

## 2020-12-06 DIAGNOSIS — C775 Secondary and unspecified malignant neoplasm of intrapelvic lymph nodes: Secondary | ICD-10-CM | POA: Insufficient documentation

## 2020-12-06 DIAGNOSIS — C61 Malignant neoplasm of prostate: Secondary | ICD-10-CM | POA: Diagnosis present

## 2020-12-08 ENCOUNTER — Other Ambulatory Visit: Payer: Self-pay

## 2020-12-08 ENCOUNTER — Other Ambulatory Visit: Payer: Self-pay | Admitting: Student

## 2020-12-08 ENCOUNTER — Ambulatory Visit (HOSPITAL_COMMUNITY)
Admission: RE | Admit: 2020-12-08 | Discharge: 2020-12-08 | Disposition: A | Discharge status: Home / Self Care | Payer: Medicare Other | Source: Ambulatory Visit | Attending: Oncology | Admitting: Oncology

## 2020-12-08 DIAGNOSIS — C61 Malignant neoplasm of prostate: Secondary | ICD-10-CM | POA: Diagnosis not present

## 2020-12-08 DIAGNOSIS — R2681 Unsteadiness on feet: Secondary | ICD-10-CM | POA: Insufficient documentation

## 2020-12-08 DIAGNOSIS — C775 Secondary and unspecified malignant neoplasm of intrapelvic lymph nodes: Secondary | ICD-10-CM | POA: Insufficient documentation

## 2020-12-09 ENCOUNTER — Encounter (HOSPITAL_COMMUNITY): Payer: Self-pay

## 2020-12-09 ENCOUNTER — Ambulatory Visit (HOSPITAL_COMMUNITY)
Admission: RE | Admit: 2020-12-09 | Discharge: 2020-12-09 | Disposition: A | Discharge status: Home / Self Care | Payer: Medicare Other | Source: Ambulatory Visit | Attending: Oncology | Admitting: Oncology

## 2020-12-09 ENCOUNTER — Telehealth: Payer: Self-pay | Admitting: Oncology

## 2020-12-09 ENCOUNTER — Other Ambulatory Visit: Payer: Self-pay | Admitting: Oncology

## 2020-12-09 ENCOUNTER — Other Ambulatory Visit: Payer: Self-pay

## 2020-12-09 DIAGNOSIS — C61 Malignant neoplasm of prostate: Secondary | ICD-10-CM | POA: Insufficient documentation

## 2020-12-09 DIAGNOSIS — C775 Secondary and unspecified malignant neoplasm of intrapelvic lymph nodes: Secondary | ICD-10-CM

## 2020-12-09 DIAGNOSIS — Z8546 Personal history of malignant neoplasm of prostate: Secondary | ICD-10-CM | POA: Diagnosis not present

## 2020-12-09 DIAGNOSIS — R591 Generalized enlarged lymph nodes: Secondary | ICD-10-CM | POA: Diagnosis present

## 2020-12-09 HISTORY — PX: IR IMAGING GUIDED PORT INSERTION: IMG5740

## 2020-12-09 LAB — CBC
HCT: 39.1 % (ref 39.0–52.0)
Hemoglobin: 13.5 g/dL (ref 13.0–17.0)
MCH: 30.5 pg (ref 26.0–34.0)
MCHC: 34.5 g/dL (ref 30.0–36.0)
MCV: 88.5 fL (ref 80.0–100.0)
Platelets: 425 10*3/uL — ABNORMAL HIGH (ref 150–400)
RBC: 4.42 MIL/uL (ref 4.22–5.81)
RDW: 12.2 % (ref 11.5–15.5)
WBC: 5 10*3/uL (ref 4.0–10.5)
nRBC: 0 % (ref 0.0–0.2)

## 2020-12-09 LAB — GLUCOSE, CAPILLARY: Glucose-Capillary: 146 mg/dL — ABNORMAL HIGH (ref 70–99)

## 2020-12-09 LAB — PROTIME-INR
INR: 0.9 (ref 0.8–1.2)
Prothrombin Time: 12.2 seconds (ref 11.4–15.2)

## 2020-12-09 MED ORDER — FENTANYL CITRATE (PF) 100 MCG/2ML IJ SOLN
INTRAMUSCULAR | Status: AC
  Filled 2020-12-09: qty 2

## 2020-12-09 MED ORDER — FENTANYL CITRATE (PF) 100 MCG/2ML IJ SOLN
INTRAMUSCULAR | Status: AC | PRN
  Administered 2020-12-09: 50 ug via INTRAVENOUS

## 2020-12-09 MED ORDER — MIDAZOLAM HCL 2 MG/2ML IJ SOLN
INTRAMUSCULAR | Status: AC | PRN
Start: 2020-12-09 — End: 2020-12-09
  Administered 2020-12-09 (×2): 1 mg via INTRAVENOUS

## 2020-12-09 MED ORDER — HEPARIN SOD (PORK) LOCK FLUSH 100 UNIT/ML IV SOLN
INTRAVENOUS | Status: AC | PRN
  Administered 2020-12-09: 500 [IU] via INTRAVENOUS

## 2020-12-09 MED ORDER — MIDAZOLAM HCL 2 MG/2ML IJ SOLN
INTRAMUSCULAR | Status: AC
  Filled 2020-12-09: qty 4

## 2020-12-09 MED ORDER — SODIUM CHLORIDE 0.9 % IV SOLN: INTRAVENOUS | Status: DC

## 2020-12-09 MED ORDER — MIDAZOLAM HCL 2 MG/2ML IJ SOLN
INTRAMUSCULAR | Status: AC | PRN
  Administered 2020-12-09: 1 mg via INTRAVENOUS

## 2020-12-09 MED ORDER — HEPARIN SOD (PORK) LOCK FLUSH 100 UNIT/ML IV SOLN
INTRAVENOUS | Status: AC
  Filled 2020-12-09: qty 5

## 2020-12-09 MED ORDER — LIDOCAINE-EPINEPHRINE 1 %-1:100000 IJ SOLN
INTRAMUSCULAR | Status: AC | PRN
  Administered 2020-12-09: 10 mL

## 2020-12-09 MED ORDER — LIDOCAINE-EPINEPHRINE 1 %-1:100000 IJ SOLN
INTRAMUSCULAR | Status: AC
  Filled 2020-12-09: qty 1

## 2020-12-09 NOTE — H&P (Signed)
Chief Complaint: Patient was seen in consultation today for image guided biopsy of intrapelvic lymphadenopathy and image guided port-a-catheter placement at the request of Shadad,Firas N  Referring Physician(s): Wyatt Portela  Supervising Physician: Jacqulynn Cadet  Patient Status: Theodore Foley  History of Present Illness: Theodore Foley is a 65 y.o. male new to our service. His past medical history includes HTN, hyperlipidemia, DM, colon polyps, and history of prostate cancer diagnosed in 2017 s/p prostatectomy. He used live in [place], and relocated to [place] in 2021 and established care with Dr. Tresa Moore.  In March 2021 patient developed bilateral pelvic lymphadenopathy and PSA 5.2. PSA was normalized after courses of hormone therapy; however, CT scan on February 2022 showed a worsening pelvic and retroperitoneal adenopathy and possibly lung metastasis. Patient was referred to oncology for further management of the worsening lymphadenopathy in patient with hx of prostate cancer.   IR was requested for image guided biopsy of intrapelvic lymph node and port-a-catheter placement.   Patient laying in bed, not in acute distress.  Denise headache, fever, chills, shortness of breath, cough, chest pain, abdominal pain, nausea ,vomiting, and bleeding.   Past Medical History:  Diagnosis Date  . Anxiety   . Colon polyps   . Depression   . Diabetes mellitus without complication ([place])   . GERD (gastroesophageal reflux disease)   . Hyperlipidemia   . Hypertension   . prostate ca dx'd 01/2016   prostate cancer-prostatectomy in 2017  . Sleep apnea    no cpap at present  . Urine incontinence     Past Surgical History:  Procedure Laterality Date  . APPENDECTOMY  2012  . COLONOSCOPY    . HEMICOLECTOMY Right 2012  . PROSTATECTOMY  2017    Allergies: Patient has no known allergies.  Medications: Prior to Admission medications   Medication Sig Start Date End Date Taking?  Authorizing Provider  ACCU-CHEK AVIVA PLUS test strip USE 2 (TWO) TIMES DAILY AS DIRECTED FOR HYPERGLYCEMIA 04/11/18   [provider]  aspirin 81 MG chewable tablet Chew by mouth. 03/11/19   [provider]  buPROPion (WELLBUTRIN SR) 200 MG 12 hr tablet Take 1 tablet (200 mg total) by mouth 2 (two) times daily. 07/07/20   Orma Flaming, MD  cholecalciferol (VITAMIN D3) 25 MCG (1000 UNIT) tablet Take 1,000 Units by mouth daily.    [provider]  gabapentin (NEURONTIN) 300 MG capsule Take 1 capsule (300 mg total) by mouth 3 (three) times daily. 11/30/20   Orma Flaming, MD  lisinopril-hydrochlorothiazide (ZESTORETIC) 20-12.5 MG tablet TAKE 2 TABLETS BY MOUTH EVERY DAY 08/25/20   Orma Flaming, MD  montelukast (SINGULAIR) 10 MG tablet TAKE 1 TABLET BY MOUTH EVERYDAY AT BEDTIME 02/16/20   Orma Flaming, MD  oxybutynin (DITROPAN-XL) 5 MG 24 hr tablet TAKE 1 TABLET BY MOUTH EVERYDAY AT BEDTIME 12/16/19   Orma Flaming, MD  pantoprazole (PROTONIX) 40 MG tablet Take 1 tablet (40 mg total) by mouth daily. 11/08/20   Orma Flaming, MD  rosuvastatin (CRESTOR) 20 MG tablet TAKE 1 TABLET BY MOUTH EVERY DAY 02/16/20   Orma Flaming, MD  TRULICITY 1.5 TZ/0.0FV SOPN INJECT 1.5MG  INTO THE SKIN ONCE A WEEK 07/26/20   Orma Flaming, MD  Vitamins-Lipotropics (VITA-PLUS G) CAPS Take by mouth.    [provider]     Family History  Problem Relation Age of Onset  . Heart attack Mother   . Heart disease Mother   . Miscarriages / [place] Mother   .  Depression Father   . Hyperlipidemia Father   . Hypertension Father   . Alcohol abuse Sister   . Cancer Brother   . Depression Brother   . Early death Brother   . Hearing loss Brother   . Heart attack Brother   . Heart disease Brother   . Hyperlipidemia Brother   . Hypertension Brother   . Depression Daughter   . Depression Son   . Heart disease Maternal Grandmother   . Hypertension Maternal Grandmother   . Cancer  Maternal Grandfather   . Depression Paternal Grandmother   . Heart attack Paternal Grandfather   . Heart disease Paternal Grandfather   . Diabetes Brother   . Hypertension Brother   . Pancreatic cancer Brother   . Depression Daughter   . Depression Daughter   . Depression Son   . Colon cancer Neg Hx   . Colon polyps Neg Hx   . Esophageal cancer Neg Hx   . Stomach cancer Neg Hx   . Rectal cancer Neg Hx     Social History   Socioeconomic History  . Marital status: Widowed    Spouse name: Not on file  . Number of children: Not on file  . Years of education: Not on file  . Highest education level: Not on file  Occupational History  . Not on file  Tobacco Use  . Smoking status: Former Research scientist (life sciences)  . Smokeless tobacco: Never Used  . Tobacco comment: quit 2015  Vaping Use  . Vaping Use: Never used  Substance and Sexual Activity  . Alcohol use: Yes    Alcohol/week: 6.0 standard drinks    Types: 6 Cans of beer per week  . Drug use: Never  . Sexual activity: Not on file  Other Topics Concern  . Not on file  Social History Narrative  . Not on file   Social Determinants of Health   Financial Resource Strain: Not on file  Food Insecurity: Not on file  Transportation Needs: Not on file  Physical Activity: Not on file  Stress: Not on file  Social Connections: Not on file     Review of Systems: A 12 point ROS discussed and pertinent positives are indicated in the HPI above.  All other systems are negative.   Vital Signs: There were no vitals taken for this visit.  Physical Exam Vitals and nursing note reviewed.  Constitutional:      General: He is not in acute distress.    Appearance: Normal appearance. He is not ill-appearing.  Cardiovascular:     Rate and Rhythm: Normal rate. Rhythm irregular.     Heart sounds: Normal heart sounds.     Comments: Several early beat noted.  Pulmonary:     Effort: Pulmonary effort is normal.     Breath sounds: Normal breath sounds.   Abdominal:     General: Bowel sounds are normal.     Palpations: Abdomen is soft.  Neurological:     Mental Status: He is alert and oriented to person, place, and time.  Psychiatric:        Mood and Affect: Mood normal.        Behavior: Behavior normal.     MD Evaluation Airway: WNL Heart: WNL Abdomen: WNL Chest/ Lungs: WNL ASA  Classification: 3 Mallampati/Airway Score: Two  Imaging: CT ABDOMEN PELVIS WO CONTRAST  Result Date: 11/17/2020 CLINICAL DATA:  Weight loss. 23 pounds in 3 weeks. History of prostate cancer. Oral contrast only as patient's creatinine  is elevated. EXAM: CT ABDOMEN AND PELVIS WITHOUT CONTRAST TECHNIQUE: Multidetector CT imaging of the abdomen and pelvis was performed following the standard protocol without IV contrast. COMPARISON:  CT 12/12/2019 FINDINGS: Lower chest: New rounded subpleural nodule in the RIGHT lower lobe measures 10 mm (image 19/4). Round RIGHT lobe nodule measures 6 mm on image number 1. RIGHT middle lobe nodule measures 5 mm on same image. New nodule the lateral RIGHT lung base measuring 5 mm on image 36. Small nodules in the azygoesophageal recess of the RIGHT lower lobe. Hepatobiliary: No focal hepatic lesion on noncontrast exam. Small gallstone noted. Pancreas: Pancreas is normal. No ductal dilatation. No pancreatic inflammation. Spleen: Normal spleen Adrenals/urinary tract: Adrenal glands normal. No obstructive uropathy. Ureters and bladder normal. Stomach/Bowel: Stomach and duodenum normal. Small bowel normal. Partial RIGHT hemicolectomy. Several diverticula of the sigmoid colon without acute inflammation. Rectum normal. Vascular/Lymphatic: Abdominal is normal caliber. There are mildly enlarged periaortic lymph nodes. For example lymph node deep to the IVC measuring 8 mm image 43/2. Lymph node deep to the aorta at same level measures 7 mm. Adenopathy in the pelvis noted which is new from CT 12/12/2019. LEFT common iliac node measures 13 mm image  56. RIGHT internal iliac node measures 16 mm image 71. RIGHT external iliac node measures 12 mm image 65. Large node in the RIGHT operator space measuring 26 mm on image 70. Reproductive: Post prostatectomy. Other: No free fluid. Musculoskeletal: Endplate sclerosis and osteophytosis in the lumbar spine. No clear aggressive osseous lesion. IMPRESSION: 1. New and enlarged pelvic lymphadenopathy and periaortic retroperitoneal adenopathy is a pattern typical of metastatic prostate cancer lymphadenopathy. 2. New bilateral lower lobe round pulmonary nodules of varying size is a pattern typical of pulmonary metastasis. 3. No skeletal metastasis identified. Electronically Signed   By: Suzy Bouchard M.D.   On: 11/17/2020 18:42   CT Chest Wo Contrast  Result Date: 12/06/2020 CLINICAL DATA:  Lung nodule on recent CT abdomen pelvis. Weight loss, prostate cancer. EXAM: CT CHEST WITHOUT CONTRAST TECHNIQUE: Multidetector CT imaging of the chest was performed following the standard protocol without IV contrast. COMPARISON:  CT abdomen pelvis 11/17/2020 and 12/12/2019. FINDINGS: Cardiovascular: Atherosclerotic calcification of the aorta, aortic valve and coronary arteries. Heart size normal. No pericardial effusion. Mediastinum/Nodes: No pathologically enlarged mediastinal or axillary lymph nodes. Hilar regions are difficult to definitively evaluate without IV contrast but appear grossly unremarkable. Esophagus is grossly unremarkable. Lungs/Pleura: Centrilobular emphysema. There are multiple bilateral pulmonary nodules which measure up to 10 mm (9 x 11 mm, 5/103) in the anterior right lower lobe. Scarring in the left lower lobe. Those seen in the lung bases are new from 12/12/2019. No pleural fluid. Airway is unremarkable. Upper Abdomen: Visualized portions of the liver, gallbladder, adrenal glands, kidneys, spleen pancreas, stomach and bowel are grossly unremarkable. Musculoskeletal: Degenerative changes in the spine. No  worrisome lytic or sclerotic lesions. IMPRESSION: 1. Multiple bilateral pulmonary nodules measure up to 10 mm. Those seen in the lung bases are new from 12/12/2019. Findings are indicative of metastatic disease. 2. Aortic atherosclerosis (ICD10-I70.0). Coronary artery calcification. 3.  Emphysema (ICD10-J43.9). Electronically Signed   By: Lorin Picket M.D.   On: 12/06/2020 16:04   MR Brain Wo Contrast  Result Date: 12/08/2020 CLINICAL DATA:  Urologic cancer, staging. EXAM: MRI HEAD WITHOUT CONTRAST TECHNIQUE: Multiplanar, multiecho pulse sequences of the brain and surrounding structures were obtained without intravenous contrast. COMPARISON:  None. FINDINGS: Brain: No acute infarction, acute hemorrhage, hydrocephalus, or extra-axial fluid collection.  Focal small area of T2/FLAIR hyperintensity in the juxtacortical right frontal lobe (series 7, image 17). Mild prominence of the extra-axial spaces overlying the posterior parieto-occipital region bilaterally. Vascular: Major arterial flow voids are maintained at the skull base. Skull and upper cervical spine: Normal marrow signal. Sinuses/Orbits: Mild mucosal thickening without air-fluid levels. Unremarkable orbits. Other: No mastoid effusions. IMPRESSION: Focal small area of T2/FLAIR hyperintensity in the juxtacortical right posterior frontal lobe (series 7, image 17) is favored to reflect the sequela of chronic microvascular ischemic disease, but a small metastasis in this region is difficult to exclude without contrast or priors. A short interval follow-up MRI in approximately 6 weeks could assess stability. Electronically Signed   By: Margaretha Sheffield MD   On: 12/08/2020 18:04   ECHOCARDIOGRAM COMPLETE  Result Date: 12/03/2020    ECHOCARDIOGRAM REPORT   Patient Name:   Adnan Hudnall Date of Exam: 12/03/2020 Medical Rec #:  [id number]  Height:       73.0 in Accession #:    [id number] Weight:       226.8 lb Date of Birth:  [birth year]  BSA:          2.270 m  Patient Age:    46 years   BP:           134/82 mmHg Patient Gender: M          HR:           82 bpm. Exam Location:  Outpatient Procedure: 2D Echo Indications:    palpitations. Prostate Cancer.  History:        Patient has no prior history of Echocardiogram examinations.                 Abnormal ECG, Signs/Symptoms:palpitations; Risk Factors:Sleep                 Apnea, Diabetes, Dyslipidemia and Hypertension.  Sonographer:    Johny Chess RDCS Referring Phys: [id number] Elouise Munroe  Sonographer Comments: Image acquisition challenging due to respiratory motion. Global longitudinal strain was attempted. IMPRESSIONS  1. Left ventricular ejection fraction, by estimation, is 60 to 65%. The left ventricle has normal function. The left ventricle has no regional wall motion abnormalities. There is mild asymmetric left ventricular hypertrophy of the posterior-lateral segment. Left ventricular diastolic parameters are indeterminate.  2. Right ventricular systolic function is normal. The right ventricular size is normal. Tricuspid regurgitation signal is inadequate for assessing PA pressure.  3. The mitral valve is normal in structure. No evidence of mitral valve regurgitation. No evidence of mitral stenosis.  4. The aortic valve is tricuspid. Aortic valve regurgitation is not visualized. No aortic stenosis is present.  5. The inferior vena cava is normal in size with greater than 50% respiratory variability, suggesting right atrial pressure of 3 mmHg. FINDINGS  Left Ventricle: Left ventricular ejection fraction, by estimation, is 60 to 65%. The left ventricle has normal function. The left ventricle has no regional wall motion abnormalities. Global longitudinal strain performed but not reported based on interpreter judgement due to suboptimal tracking. The left ventricular internal cavity size was normal in size. There is mild asymmetric left ventricular hypertrophy of the posterior-lateral segment. Left ventricular  diastolic parameters are indeterminate. Right Ventricle: The right ventricular size is normal. No increase in right ventricular wall thickness. Right ventricular systolic function is normal. Tricuspid regurgitation signal is inadequate for assessing PA pressure. Left Atrium: Left atrial size was normal in size. Right Atrium: Right atrial size was normal  in size. Pericardium: There is no evidence of pericardial effusion. Presence of pericardial fat pad. Mitral Valve: The mitral valve is normal in structure. No evidence of mitral valve regurgitation. No evidence of mitral valve stenosis. Tricuspid Valve: The tricuspid valve is normal in structure. Tricuspid valve regurgitation is not demonstrated. No evidence of tricuspid stenosis. Aortic Valve: The aortic valve is tricuspid. Aortic valve regurgitation is not visualized. No aortic stenosis is present. Pulmonic Valve: The pulmonic valve was grossly normal. Pulmonic valve regurgitation is trivial. No evidence of pulmonic stenosis. Aorta: The aortic root is normal in size and structure. Venous: The inferior vena cava is normal in size with greater than 50% respiratory variability, suggesting right atrial pressure of 3 mmHg. IAS/Shunts: The interatrial septum appears to be lipomatous. No atrial level shunt detected by color flow Doppler.  LEFT VENTRICLE PLAX 2D LVIDd:         4.40 cm LVIDs:         2.60 cm LV PW:         1.20 cm LV IVS:        1.00 cm LVOT diam:     2.10 cm LV SV:         63 LV SV Index:   28 LVOT Area:     3.46 cm  RIGHT VENTRICLE         IVC TAPSE (M-mode): 1.6 cm  IVC diam: 1.30 cm LEFT ATRIUM             Index       RIGHT ATRIUM           Index LA diam:        4.00 cm 1.76 cm/m  RA Area:     12.10 cm LA Vol (A2C):   47.2 ml 20.80 ml/m RA Volume:   25.20 ml  11.10 ml/m LA Vol (A4C):   37.0 ml 16.30 ml/m LA Biplane Vol: 42.7 ml 18.81 ml/m  AORTIC VALVE LVOT Vmax:   95.50 cm/s LVOT VTI:    0.181 m  AORTA Ao Root diam: 3.20 cm Ao Asc diam:  2.90 cm   SHUNTS Systemic VTI:  0.18 m Systemic Diam: 2.10 cm Cherlynn Kaiser MD Electronically signed by Cherlynn Kaiser MD Signature Date/Time: 12/03/2020/10:43:01 PM    Final     Labs:  CBC: Recent Labs    07/28/20 1109 11/08/20 1529 11/26/20 0924 12/09/20 0741  WBC 6.9 5.5 5.1 5.0  HGB 13.8 13.5 13.6 13.5  HCT 40.9 39.4 39.4 39.1  PLT 392 371.0 347.0 425*    COAGS: Recent Labs    12/09/20 0741  INR 0.9    BMP: Recent Labs    07/28/20 1109 11/08/20 1529 11/17/20 1713 11/26/20 0924  NA 137 133*  --  136  K 4.9 4.3  --  4.0  CL 99 94*  --  98  CO2 28 31  --  28  GLUCOSE 125* 91  --  139*  BUN 23 22  --  21  CALCIUM 10.1 10.0  --  9.8  CREATININE 1.11 1.64* 2.10* 1.34  GFRNONAA 69  --   --   --   GFRAA 80  --   --   --     LIVER FUNCTION TESTS: Recent Labs    07/28/20 1109 11/08/20 1529 11/26/20 0924  BILITOT 0.7 0.3 0.5  AST 17 19 19   ALT 16 16 24   ALKPHOS  --  84 69  PROT 7.4 7.8 7.3  ALBUMIN  --  4.3 4.1    TUMOR MARKERS: No results for input(s): AFPTM, CEA, CA199, CHROMGRNA in the last 8760 hours.  Assessment and Plan: Intrapelvic lymphadenopathy in patient with history of prostate cancer.  Image guided biopsy of intrapelvic lymph node and port-a-catheter placement.  NPO since midnight INR 0.9   Risks and benefits of biopsy was discussed with the patient and/or patient's family including, but not limited to bleeding, infection, damage to adjacent structures or low yield requiring additional tests.  Risks and benefits of image guided port-a-catheter placement was discussed with the patient including, but not limited to bleeding, infection, pneumothorax, or fibrin sheath development and need for additional procedures.   All of the questions were answered and there is agreement to proceed.  Consent signed and in chart.    Thank you for this interesting consult.  I greatly enjoyed meeting Theodore Foley and look forward to participating in their care.   A copy of this report was sent to the requesting provider on this date.  Electronically Signed: Tera Mater, PA-C 12/09/2020, 8:33 AM   I spent a total of  30 Minutes   in face to face in clinical consultation, greater than 50% of which was counseling/coordinating care for image guided biopsy of intrapelvic lymph node and port-a-catheter placement.

## 2020-12-09 NOTE — Discharge Instructions (Signed)
Please call Interventional Radiology clinic [phone] with any questions or concerns.  You may remove your dressing and shower tomorrow.  DO NOT use EMLA cream for 2 weeks after port placement as this cream will remove surgical glue on your incision.   Implanted Port Insertion, Care After This sheet gives you information about how to care for yourself after your procedure. Your health care provider may also give you more specific instructions. If you have problems or questions, contact your health care provider. What can I expect after the procedure? After the procedure, it is common to have:  Discomfort at the port insertion site.  Bruising on the skin over the port. This should improve over 3-4 days. Follow these instructions at home: Port care  After your port is placed, you will get a manufacturer's information card. The card has information about your port. Keep this card with you at all times.  Take care of the port as told by your health care provider. Ask your health care provider if you or a family member can get training for taking care of the port at home. A home health care nurse may also take care of the port.  Make sure to remember what type of port you have. Incision care  Follow instructions from your health care provider about how to take care of your port insertion site. Make sure you: ? Wash your hands with soap and water before and after you change your bandage (dressing). If soap and water are not available, use hand sanitizer. ? Change your dressing as told by your health care provider. ? Leave stitches (sutures), skin glue, or adhesive strips in place. These skin closures may need to stay in place for 2 weeks or longer. If adhesive strip edges start to loosen and curl up, you may trim the loose edges. Do not remove adhesive strips completely unless your health care provider tells you to do that.  Check your port insertion site every day for signs of infection.  Check for: ? Redness, swelling, or pain. ? Fluid or blood. ? Warmth. ? Pus or a bad smell.      Activity  Return to your normal activities as told by your health care provider. Ask your health care provider what activities are safe for you.  Do not lift anything that is heavier than 10 lb (4.5 kg), or the limit that you are told, until your health care provider says that it is safe. General instructions  Take over-the-counter and prescription medicines only as told by your health care provider.  Do not take baths, swim, or use a hot tub until your health care provider approves. Ask your health care provider if you may take showers. You may only be allowed to take sponge baths.  Do not drive for 24 hours if you were given a sedative during your procedure.  Wear a medical alert bracelet in case of an emergency. This will tell any health care providers that you have a port.  Keep all follow-up visits as told by your health care provider. This is important. Contact a health care provider if:  You cannot flush your port with saline as directed, or you cannot draw blood from the port.  You have a fever or chills.  You have redness, swelling, or pain around your port insertion site.  You have fluid or blood coming from your port insertion site.  Your port insertion site feels warm to the touch.  You have pus or a   bad smell coming from the port insertion site. Get help right away if:  You have chest pain or shortness of breath.  You have bleeding from your port that you cannot control. Summary  Take care of the port as told by your health care provider. Keep the manufacturer's information card with you at all times.  Change your dressing as told by your health care provider.  Contact a health care provider if you have a fever or chills or if you have redness, swelling, or pain around your port insertion site.  Keep all follow-up visits as told by your health care  provider. This information is not intended to replace advice given to you by your health care provider. Make sure you discuss any questions you have with your health care provider. Document Revised: 04/09/2018 Document Reviewed: 04/09/2018 Elsevier Patient Education  2021 Else[employer] Inc.   Needle Biopsy, Care After These instructions tell you how to care for yourself after your procedure. Your doctor may also give you more specific instructions. Call your doctor if you have any problems or questions. What can I expect after the procedure? After the procedure, it is common to have:  Soreness.  Bruising.  Mild pain. Follow these instructions at home:  Return to your normal activities as told by your doctor. Ask your doctor what activities are safe for you.  Take over-the-counter and prescription medicines only as told by your doctor.  Wash your hands with soap and water before you change your bandage (dressing). If you cannot use soap and water, use hand sanitizer.  Follow instructions from your doctor about: ? How to take care of your puncture site. ? When and how to change your bandage. ? When to remove your bandage.  Check your puncture site every day for signs of infection. Watch for: ? Redness, swelling, or pain. ? Fluid or blood. ? Pus or a bad smell. ? Warmth.  Do not take baths, swim, or use a hot tub until your doctor approves. Ask your doctor if you may take showers. You may only be allowed to take sponge baths.  Keep all follow-up visits as told by your doctor. This is important.   Contact a doctor if you have:  A fever.  Redness, swelling, or pain at the puncture site, and it lasts longer than a few days.  Fluid, blood, or pus coming from the puncture site.  Warmth coming from the puncture site. Get help right away if:  You have a lot of bleeding from the puncture site. Summary  After the procedure, it is common to have soreness, bruising, or mild pain at  the puncture site.  Check your puncture site every day for signs of infection, such as redness, swelling, or pain.  Get help right away if you have severe bleeding from your puncture site. This information is not intended to replace advice given to you by your health care provider. Make sure you discuss any questions you have with your health care provider. Document Revised: 03/11/2020 Document Reviewed: 03/11/2020 Elsevier Patient Education  2021 [employer] Inc.    Moderate Conscious Sedation, Adult, Care After This sheet gives you information about how to care for yourself after your procedure. Your health care provider may also give you more specific instructions. If you have problems or questions, contact your health care provider. What can I expect after the procedure? After the procedure, it is common to have:  Sleepiness for several hours.  Impaired judgment for several hours.    Difficulty with balance.  Vomiting if you eat too soon. Follow these instructions at home: For the time period you were told by your health care provider:  Rest.  Do not participate in activities where you could fall or become injured.  Do not drive or use machinery.  Do not drink alcohol.  Do not take sleeping pills or medicines that cause drowsiness.  Do not make important decisions or sign legal documents.  Do not take care of children on your own.      Eating and drinking  Follow the diet recommended by your health care provider.  Drink enough fluid to keep your urine pale yellow.  If you vomit: ? Drink water, juice, or soup when you can drink without vomiting. ? Make sure you have little or no nausea before eating solid foods.   General instructions  Take over-the-counter and prescription medicines only as told by your health care provider.  Have a responsible adult stay with you for the time you are told. It is important to have someone help care for you until you are awake and  alert.  Do not smoke.  Keep all follow-up visits as told by your health care provider. This is important. Contact a health care provider if:  You are still sleepy or having trouble with balance after 24 hours.  You feel light-headed.  You keep feeling nauseous or you keep vomiting.  You develop a rash.  You have a fever.  You have redness or swelling around the IV site. Get help right away if:  You have trouble breathing.  You have new-onset confusion at home. Summary  After the procedure, it is common to feel sleepy, have impaired judgment, or feel nauseous if you eat too soon.  Rest after you get home. Know the things you should not do after the procedure.  Follow the diet recommended by your health care provider and drink enough fluid to keep your urine pale yellow.  Get help right away if you have trouble breathing or new-onset confusion at home. This information is not intended to replace advice given to you by your health care provider. Make sure you discuss any questions you have with your health care provider. Document Revised: 01/09/2020 Document Reviewed: 08/07/2019 Elsevier Patient Education  2021 Elsevier Inc.  

## 2020-12-09 NOTE — Telephone Encounter (Signed)
Release: [id number]  Released records to [phone]

## 2020-12-09 NOTE — Procedures (Signed)
Interventional Radiology Procedure Note  Procedure:  1.) Placement of a right IJ approach single lumen PowerPort.  Tip is positioned at the superior cavoatrial junction and catheter is ready for immediate use.   2.) Right pelvic sidewall LN biopsy  Complications: No immediate  EBL: None.  Recommendations:  - Ok to shower tomorrow - Do not submerge for 7 days - Routine line care    Signed,  Criselda Peaches, MD

## 2020-12-13 ENCOUNTER — Other Ambulatory Visit: Payer: Self-pay

## 2020-12-13 ENCOUNTER — Inpatient Hospital Stay (HOSPITAL_BASED_OUTPATIENT_CLINIC_OR_DEPARTMENT_OTHER): Payer: Medicare Other | Admitting: Oncology

## 2020-12-13 VITALS — BP 137/87 | HR 101 | Temp 97.4°F | Resp 20 | Wt 219.0 lb

## 2020-12-13 DIAGNOSIS — C801 Malignant (primary) neoplasm, unspecified: Secondary | ICD-10-CM

## 2020-12-13 DIAGNOSIS — K219 Gastro-esophageal reflux disease without esophagitis: Secondary | ICD-10-CM | POA: Diagnosis not present

## 2020-12-13 DIAGNOSIS — E119 Type 2 diabetes mellitus without complications: Secondary | ICD-10-CM | POA: Diagnosis not present

## 2020-12-13 DIAGNOSIS — I10 Essential (primary) hypertension: Secondary | ICD-10-CM | POA: Diagnosis not present

## 2020-12-13 DIAGNOSIS — J439 Emphysema, unspecified: Secondary | ICD-10-CM | POA: Diagnosis not present

## 2020-12-13 DIAGNOSIS — G629 Polyneuropathy, unspecified: Secondary | ICD-10-CM | POA: Diagnosis not present

## 2020-12-13 DIAGNOSIS — J432 Centrilobular emphysema: Secondary | ICD-10-CM | POA: Diagnosis not present

## 2020-12-13 DIAGNOSIS — E785 Hyperlipidemia, unspecified: Secondary | ICD-10-CM | POA: Diagnosis not present

## 2020-12-13 DIAGNOSIS — R918 Other nonspecific abnormal finding of lung field: Secondary | ICD-10-CM | POA: Diagnosis not present

## 2020-12-13 DIAGNOSIS — Z86010 Personal history of colonic polyps: Secondary | ICD-10-CM | POA: Diagnosis not present

## 2020-12-13 DIAGNOSIS — R634 Abnormal weight loss: Secondary | ICD-10-CM | POA: Diagnosis not present

## 2020-12-13 DIAGNOSIS — Z7189 Other specified counseling: Secondary | ICD-10-CM | POA: Diagnosis not present

## 2020-12-13 DIAGNOSIS — Z87891 Personal history of nicotine dependence: Secondary | ICD-10-CM | POA: Diagnosis not present

## 2020-12-13 DIAGNOSIS — Z79899 Other long term (current) drug therapy: Secondary | ICD-10-CM | POA: Diagnosis not present

## 2020-12-13 DIAGNOSIS — C61 Malignant neoplasm of prostate: Secondary | ICD-10-CM | POA: Diagnosis not present

## 2020-12-13 DIAGNOSIS — G988 Other disorders of nervous system: Secondary | ICD-10-CM | POA: Diagnosis not present

## 2020-12-13 DIAGNOSIS — G4730 Sleep apnea, unspecified: Secondary | ICD-10-CM | POA: Diagnosis not present

## 2020-12-13 DIAGNOSIS — C786 Secondary malignant neoplasm of retroperitoneum and peritoneum: Secondary | ICD-10-CM | POA: Diagnosis not present

## 2020-12-13 NOTE — Progress Notes (Signed)
Hematology and Oncology Follow Up Visit  Theodore Foley [id number] [birth year] 65 y.o. 12/13/2020 11:23 AM Theodore Foley, MDWolfe, Theodore Hail, MD   Principle Diagnosis: 65 year old with prostate cancer diagnosed in 2017.  He was found to have Gleason score 9 and a PSA of 16.1.  He underwent a prostatectomy and found to have T3b disease with seminal vesicle and positive margins.  He has advanced disease with castration-resistant and likely variant histology noted in February 2022.  Prior Therapy:  He underwent a robotic prostatectomy in 2017.  He was found to have a Gleason score 9 with positive seminal vesicle and margins.    Gleason score 4+5 = 9 and the final pathological staging is T3PN0.    His postoperative PSA remains undetectable and underwent external beam radiation and 2 years of androgen deprivation.     He subsequently relocated to [facility] and establish care with Dr. Tresa Foley.  In March 2021 he developed bilateral pelvic lymphadenopathy and PSA 5.2.  He was started on Eligard and repeat PSA in June 2021 was 0.49.  Theodore Foley was started in March 2021.  I  n October 2021 his PSA was 0.87 and received Eligard 45 mg every 6 months after that.  In January 2022 his PSA remained at 0.36.  In February 2022 he underwent a CT scan which showed a worsening pelvic on retroperitoneal adenopathy and possibly lung metastasis.  His PSA at that time was 0.2.     Current therapy: Under evaluation to start salvage therapy.  Interim History: Theodore Foley returns today for a follow-up visit accompanied by his daughter.  Since the last visit, he has noticed worsening neuropathy predominantly motor function affecting his upper and lower extremities.  He has reported complains of occasional dizziness and unsteadiness and near syncope.  He has started on Neurontin which she has taken at nighttime with mitigation of his symptoms.  His appetite has been down lost more weight.  He denies any nausea, vomiting or  abdominal pain.  He denies any other neurological symptoms.     Medications: I have reviewed the patient's current medications.  Current Outpatient Medications  Medication Sig Dispense Refill  . ACCU-CHEK AVIVA PLUS test strip USE 2 (TWO) TIMES DAILY AS DIRECTED FOR HYPERGLYCEMIA  21  . aspirin 81 MG chewable tablet Chew by mouth.    Marland Kitchen buPROPion (WELLBUTRIN SR) 200 MG 12 hr tablet Take 1 tablet (200 mg total) by mouth 2 (two) times daily. 180 tablet 1  . cholecalciferol (VITAMIN D3) 25 MCG (1000 UNIT) tablet Take 1,000 Units by mouth daily.    Marland Kitchen gabapentin (NEURONTIN) 300 MG capsule Take 1 capsule (300 mg total) by mouth 3 (three) times daily. 90 capsule 3  . lisinopril-hydrochlorothiazide (ZESTORETIC) 20-12.5 MG tablet TAKE 2 TABLETS BY MOUTH EVERY DAY 180 tablet 0  . montelukast (SINGULAIR) 10 MG tablet TAKE 1 TABLET BY MOUTH EVERYDAY AT BEDTIME 90 tablet 3  . oxybutynin (DITROPAN-XL) 5 MG 24 hr tablet TAKE 1 TABLET BY MOUTH EVERYDAY AT BEDTIME 90 tablet 3  . pantoprazole (PROTONIX) 40 MG tablet Take 1 tablet (40 mg total) by mouth daily. 90 tablet 1  . rosuvastatin (CRESTOR) 20 MG tablet TAKE 1 TABLET BY MOUTH EVERY DAY 90 tablet 3  . TRULICITY 1.5 OH/6.0VP SOPN INJECT 1.5MG  INTO THE SKIN ONCE A WEEK 6 mL 1  . Vitamins-Lipotropics (VITA-PLUS G) CAPS Take by mouth.     No current facility-administered medications for this visit.     Allergies:  Allergies  Allergen Reactions  . Tape Other (See Comments)    skin was red under EKG electrodes      Physical Exam: Blood pressure 137/87, pulse (!) 101, temperature (!) 97.4 F (36.3 C), temperature source Tympanic, resp. rate 20, weight 219 lb (99.3 kg), SpO2 100 %. ECOG: 1   General appearance: Comfortable appearing without any discomfort Head: Normocephalic without any trauma Oropharynx: Mucous membranes are moist and pink without any thrush or ulcers. Eyes: Pupils are equal and round reactive to light. Lymph nodes: No  cervical, supraclavicular, inguinal or axillary lymphadenopathy.   Heart:regular rate and rhythm.  S1 and S2 without leg edema. Lung: Clear without any rhonchi or wheezes.  No dullness to percussion. Abdomin: Soft, nontender, nondistended with good bowel sounds.  No hepatosplenomegaly. Musculoskeletal: No joint deformity or effusion.  Full range of motion noted. Neurological: No deficits noted on motor, sensory and deep tendon reflex exam. Skin: No petechial rash or dryness.  Appeared moist.      Lab Results: Lab Results  Component Value Date   WBC 5.0 12/09/2020   HGB 13.5 12/09/2020   HCT 39.1 12/09/2020   MCV 88.5 12/09/2020   PLT 425 (H) 12/09/2020     Chemistry      Component Value Date/Time   NA 136 11/26/2020 0924   K 4.0 11/26/2020 0924   CL 98 11/26/2020 0924   CO2 28 11/26/2020 0924   BUN 21 11/26/2020 0924   CREATININE 1.34 11/26/2020 0924   CREATININE 1.11 07/28/2020 1109      Component Value Date/Time   CALCIUM 9.8 11/26/2020 0924   ALKPHOS 69 11/26/2020 0924   AST 19 11/26/2020 0924   ALT 24 11/26/2020 0924   BILITOT 0.5 11/26/2020 0924       IMPRESSION: Focal small area of T2/FLAIR hyperintensity in the juxtacortical right posterior frontal lobe (series 7, image 17) is favored to reflect the sequela of chronic microvascular ischemic disease, but a small metastasis in this region is difficult to exclude without contrast or priors. A short interval follow-up MRI in approximately 6 weeks could assess stability.   IMPRESSION: 1. Multiple bilateral pulmonary nodules measure up to 10 mm. Those seen in the lung bases are new from 12/12/2019. Findings are indicative of metastatic disease. 2. Aortic atherosclerosis (ICD10-I70.0). Coronary artery calcification. 3.  Emphysema (ICD10-J43.9).   Impression and Plan:   65 year old man with:   1.  Advanced prostate cancer initially diagnosed in 2017.  He was found to have Gleason score of 9 and a PSA  of 16.  He developed lymphadenopathy and a rise in his PSA in March 2021 and has been on Theodore Foley.  His disease status was updated at this time and imaging studies of the chest and brain were reviewed.  He has a clear progression of disease with PSA that is undetectable indicating transformation into more aggressive malignancy.  Systemic chemotherapy remains his best option moving forward given his transformation.  He underwent tissue biopsy although the results are currently pending.  It is also likely will show transformation into either a small cell histology or a poorly differentiated variant of malignancy.  In anticipation of switching to a platinum based chemotherapy we discussed these complications.  Complications with nausea, vomiting, suppression hair loss as well as myelosuppression and possible neutropenia and sepsis.  The goal of chemotherapy is palliative and given the overall deterioration in his health it is paramount to start therapy as soon as possible.  This will be finalized once the  pathology results are completed.    2.  Androgen deprivation: He will continue Eligard for the time being which will be continued indefinitely.  3.    Neuropathy and abnormal MRI: Etiology is unclear to me.  To paraneoplastic syndrome is a distinct possibility especially if we are dealing with a transformation of to a poorly differentiated cancer such as small cell.  I asked him to increase his Neurontin dose to 3 times a day and will make appropriate referral to Dr. Mickeal Skinner for opinion regarding.  4.    IV access: Port-A-Cath already been inserted and ready to proceed with chemotherapy.  EMLA cream will be available to him.  5.  Antiemetics: Prescription for Compazine will be available to him once is ready to start with chemotherapy.  6.  Prognosis and goals of care: This was discussed today in detail with the patient and his daughter.  It is clear that he has progression of disease with an aggressive  malignancy.  His prognosis is poor despite his young age and reasonable performance status.  Aggressive measures are warranted however at this time.  7.  Follow-up: We will arrange for a phone visit in the next 48 hours to discuss results of the pathology and address any other issues and in person to start chemotherapy in the near future.   40  minutes were spent on this encounter.  Time was dedicated to reviewing imaging studies, discussing differential diagnosis, discussing treatment options as well as complications related to his cancer and cancer care.   Zola Button, MD 3/21/202211:23 AM

## 2020-12-14 ENCOUNTER — Telehealth: Payer: Self-pay | Admitting: Oncology

## 2020-12-14 DIAGNOSIS — C801 Malignant (primary) neoplasm, unspecified: Secondary | ICD-10-CM | POA: Insufficient documentation

## 2020-12-14 DIAGNOSIS — Z7189 Other specified counseling: Secondary | ICD-10-CM | POA: Insufficient documentation

## 2020-12-14 LAB — SURGICAL PATHOLOGY

## 2020-12-14 MED ORDER — LIDOCAINE-PRILOCAINE 2.5-2.5 % EX CREA: 1.0000 "application " | TOPICAL_CREAM | CUTANEOUS | 0 refills | Status: AC | PRN

## 2020-12-14 MED ORDER — PROCHLORPERAZINE MALEATE 10 MG PO TABS: 10.0000 mg | ORAL_TABLET | Freq: Four times a day (QID) | ORAL | 0 refills | Status: DC | PRN

## 2020-12-14 NOTE — Telephone Encounter (Signed)
Scheduled per 03/21 los, patient has been called and notified. 

## 2020-12-14 NOTE — Progress Notes (Signed)
START ON PATHWAY REGIMEN - Prostate     A cycle is every 21 days:     Carboplatin      Etoposide   **Always confirm dose/schedule in your pharmacy ordering system**  Patient Characteristics: Small Cell Carcinoma, Extensive Stage, Distant Metastases Histology: Small Cell Carcinoma, Extensive Stage Therapeutic Status: Distant Metastases  Intent of Therapy: Non-Curative / Palliative Intent, Discussed with Patient 

## 2020-12-14 NOTE — Addendum Note (Signed)
Addended by: Wyatt Portela on: 12/14/2020 11:43 AM   Modules accepted: Orders

## 2020-12-15 ENCOUNTER — Inpatient Hospital Stay (HOSPITAL_BASED_OUTPATIENT_CLINIC_OR_DEPARTMENT_OTHER): Payer: Medicare Other | Admitting: Oncology

## 2020-12-15 DIAGNOSIS — C801 Malignant (primary) neoplasm, unspecified: Secondary | ICD-10-CM | POA: Diagnosis not present

## 2020-12-15 NOTE — Progress Notes (Signed)
Hematology and Oncology Follow Up for Telemedicine Visits  Theodore Foley [id number] [birth year] 65 y.o. 12/15/2020 8:23 AM Orma Flaming, MDWolfe, Ebony Hail, MD   I connected with Theodore Foley on 12/15/20 at  9:30 AM EDT by telephone visit and verified that I am speaking with the correct person using two identifiers.   I discussed the limitations, risks, security and privacy concerns of performing an evaluation and management service by telemedicine and the availability of in-person appointments. I also discussed with the patient that there may be a patient responsible charge related to this service. The patient expressed understanding and agreed to proceed.  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  He was initially diagnosed in 2017 with Gleason score 9 and a PSA of 16.1.   Prior Therapy:He underwent a robotic prostatectomy in 2017. He was found to have a Gleason score 9 with positive seminal vesicle and margins.   Gleason score 4+5 = 9 and the final pathological staging is T3PN0.    His postoperative PSA remains undetectable and underwent external beam radiation and 2 years of androgen deprivation.    He subsequently relocated to [facility] and establish care with Dr. Tresa Moore. In March 2021 he developed bilateral pelvic lymphadenopathy and PSA 5.2. He was started on Eligard and repeat PSA in June 2021 was 0.49. Gillermina Phy was started in March 2021. I  n October 2021 his PSA was 0.87 and received Eligard 45 mg every 6 months after that. In January 2022 his PSA remained at 0.36. In February 2022 he underwent a CT scan which showed a worsening pelvic on retroperitoneal adenopathy and possibly lung metastasis. His PSA at that time was 0.2.    Current therapy: Under  evaluation still restart carboplatin and etoposide salvage chemotherapy.  Interim History: Theodore Foley reports feeling reasonably fair without any major changes in his health since his evaluation previously.  He denies any worsening neurological symptoms, headaches or falls.  His performance status remains stable.     Medications: I have reviewed the patient's current medications.  Current Outpatient Medications  Medication Sig Dispense Refill  . ACCU-CHEK AVIVA PLUS test strip USE 2 (TWO) TIMES DAILY AS DIRECTED FOR HYPERGLYCEMIA  21  . aspirin 81 MG chewable tablet Chew by mouth.    Marland Kitchen buPROPion (WELLBUTRIN SR) 200 MG 12 hr tablet Take 1 tablet (200 mg total) by mouth 2 (two) times daily. 180 tablet 1  . cholecalciferol (VITAMIN D3) 25 MCG (1000 UNIT) tablet Take 1,000 Units by mouth daily.    Marland Kitchen gabapentin (NEURONTIN) 300 MG capsule Take 1 capsule (300 mg total) by mouth 3 (three) times daily. 90 capsule 3  . lidocaine-prilocaine (EMLA) cream Apply 1 application topically as needed. 30 g 0  . lisinopril-hydrochlorothiazide (ZESTORETIC) 20-12.5 MG tablet TAKE 2 TABLETS BY MOUTH EVERY DAY 180 tablet 0  . montelukast (SINGULAIR) 10 MG tablet TAKE 1 TABLET BY MOUTH EVERYDAY AT BEDTIME 90 tablet 3  . oxybutynin (DITROPAN-XL) 5 MG 24 hr tablet TAKE 1 TABLET BY MOUTH EVERYDAY AT BEDTIME 90 tablet 3  . pantoprazole (PROTONIX) 40 MG tablet Take 1 tablet (40 mg total) by mouth daily. 90 tablet 1  . prochlorperazine (COMPAZINE) 10 MG tablet Take 1 tablet (10 mg total) by mouth every 6 (six) hours as needed for nausea or vomiting.  30 tablet 0  . rosuvastatin (CRESTOR) 20 MG tablet TAKE 1 TABLET BY MOUTH EVERY DAY 90 tablet 3  . TRULICITY 1.5 GD/9.2EQ SOPN INJECT 1.5MG  INTO THE SKIN ONCE A WEEK 6 mL 1  . Vitamins-Lipotropics (VITA-PLUS G) CAPS Take by mouth.     No current facility-administered medications for this visit.     Allergies:  Allergies  Allergen Reactions  . Tape Other (See Comments)     skin was red under EKG electrodes        Lab Results: Lab Results  Component Value Date   WBC 5.0 12/09/2020   HGB 13.5 12/09/2020   HCT 39.1 12/09/2020   MCV 88.5 12/09/2020   PLT 425 (H) 12/09/2020     Chemistry      Component Value Date/Time   NA 136 11/26/2020 0924   K 4.0 11/26/2020 0924   CL 98 11/26/2020 0924   CO2 28 11/26/2020 0924   BUN 21 11/26/2020 0924   CREATININE 1.34 11/26/2020 0924   CREATININE 1.11 07/28/2020 1109      Component Value Date/Time   CALCIUM 9.8 11/26/2020 0924   ALKPHOS 69 11/26/2020 0924   AST 19 11/26/2020 0924   ALT 24 11/26/2020 0924   BILITOT 0.5 11/26/2020 0924       Impression and Plan:  65 year old man with:   1.    Prostate cancer diagnosed localized disease in 2017 subsequently developed advanced disease with small cell transformation.  Results of the biopsy completed on December 10, 2019 was reviewed today the ramifications were discussed.  He has clear transformation to small cell cancer with lymphadenopathy and pulmonary nodules.  I recommended proceeding with carboplatin and etoposide and he is set up to start in the near future.  Education classes already scheduled.  Complication associated with his treatment with nausea, vomiting, myelosuppression, neutropenia possible sepsis were reiterated.    2. Androgen deprivation:  Next injection will be in May 2022.  I recommended continuing this for the time being.  3.   Neuropathy and possible paraneoplastic syndrome: He is scheduled with Dr. Mickeal Skinner on March 25.  4.   IV access: Port-A-Cath in use and will be utilized for chemotherapy.  EMLA cream prescribed to him.  5.  Antiemetics: Prescription for Compazine already been sent.  6.  Prognosis and goals of care: Treatment is palliative at this time but his performance status is reasonable and aggressive measures are warranted.  7. Follow-up:  Will be in the near future to start  chemotherapy.       I discussed the assessment and treatment plan with the patient. The patient was provided an opportunity to ask questions and all were answered. The patient agreed with the plan and demonstrated an understanding of the instructions.   The patient was advised to call back or seek an in-person evaluation if the symptoms worsen or if the condition fails to improve as anticipated.  I provided 20 minutes of non face-to-face telephone visit time during this encounter.  The time was dedicated to reviewing his pathology results, treatment options and complications related to therapy.  Zola Button, MD 12/15/2020 8:23 AM

## 2020-12-16 NOTE — Progress Notes (Signed)
Pharmacist Chemotherapy Monitoring - Initial Assessment    Anticipated start date: 12/21/20  Regimen:  . Are orders appropriate based on the patient's diagnosis, regimen, and cycle? Yes . Does the plan date match the patient's scheduled date? Yes . Is the sequencing of drugs appropriate? Yes . Are the premedications appropriate for the patient's regimen? Yes . Prior Authorization for treatment is: Approved o If applicable, is the correct biosimilar selected based on the patient's insurance? not applicable  Organ Function and Labs: Marland Kitchen Are dose adjustments needed based on the patient's renal function, hepatic function, or hematologic function? No . Are appropriate labs ordered prior to the start of patient's treatment? Yes . Other organ system assessment, if indicated: N/A . The following baseline labs, if indicated, have been ordered: N/A  Dose Assessment: . Are the drug doses appropriate? Yes . Are the following correct: o Drug concentrations Yes o IV fluid compatible with drug Yes o Administration routes Yes o Timing of therapy Yes . If applicable, does the patient have documented access for treatment and/or plans for port-a-cath placement? yes . If applicable, have lifetime cumulative doses been properly documented and assessed? yes Lifetime Dose Tracking  No doses have been documented on this patient for the following tracked chemicals: Doxorubicin, Epirubicin, Idarubicin, Daunorubicin, Mitoxantrone, Bleomycin, Oxaliplatin, Carboplatin, Liposomal Doxorubicin  o   Toxicity Monitoring/Prevention: . The patient has the following take home antiemetics prescribed: Prochlorperazine . The patient has the following take home medications prescribed: N/A . Medication allergies and previous infusion related reactions, if applicable, have been reviewed and addressed. Yes . The patient's current medication list has been assessed for drug-drug interactions with their chemotherapy regimen. no  significant drug-drug interactions were identified on review.  Order Review: . Are the treatment plan orders signed? Yes . Is the patient scheduled to see a provider prior to their treatment? No  I verify that I have reviewed each item in the above checklist and answered each question accordingly.   Kennith Center, Pharm.D., CPP 12/16/2020@3 :18 PM

## 2020-12-17 ENCOUNTER — Inpatient Hospital Stay (HOSPITAL_BASED_OUTPATIENT_CLINIC_OR_DEPARTMENT_OTHER): Payer: Medicare Other | Admitting: Internal Medicine

## 2020-12-17 ENCOUNTER — Other Ambulatory Visit: Payer: Self-pay

## 2020-12-17 ENCOUNTER — Inpatient Hospital Stay: Payer: Medicare Other

## 2020-12-17 DIAGNOSIS — C61 Malignant neoplasm of prostate: Secondary | ICD-10-CM | POA: Diagnosis not present

## 2020-12-17 DIAGNOSIS — G549 Nerve root and plexus disorder, unspecified: Secondary | ICD-10-CM | POA: Diagnosis not present

## 2020-12-17 NOTE — Progress Notes (Signed)
[facility] [place], [place] [address] [phone]   New Patient Evaluation  Date of Service: 12/17/20 Patient Name: Theodore Foley Patient MRN: [id number] Patient DOB: [birth year] Provider: Ventura Sellers, MD  Identifying Statement:  Theodore Foley is a 65 y.o. male with neuropathic symptoms who presents for initial consultation and evaluation regarding cancer associated neurologic deficits.    Referring Provider: Orma Flaming, MD [address] [place],  [place] [address]  Primary Cancer:  Oncologic History: Oncology History  Prostate cancer metastatic to intrapelvic lymph node ([facility])  04/09/2020 Initial Diagnosis   Prostate cancer metastatic to intrapelvic lymph node ([facility])   12/14/2020 Cancer Staging   Staging form: Prostate, AJCC 8th Edition - Clinical: Stage IVB (cTX, cNX, pM1c, Grade Group: 5) - Signed by Wyatt Portela, MD on 12/14/2020 Histologic grading system: 5 grade system   Small cell carcinoma ([facility])  12/14/2020 Initial Diagnosis   Small cell carcinoma ([facility])   12/21/2020 -  Chemotherapy    Patient is on Treatment Plan: PROSTATE SMALL CELL CARBOPLATIN D1 + ETOPOSIDE D1-3 Q21D        History of Present Illness: The patient's records from the referring physician were obtained and reviewed and the patient interviewed to confirm this HPI.  Theodore Foley presents to discuss recent neurologic symptoms.  He began to experience numbness and tingling in the palmar aspect of his right hand (and fingers) this January, roughly 2 months ago.  This later developed into weakness of grip strength and clumsiness with the hand.  Starting ~1 month ago, he began to experience cascade of symptoms, characterized by numbness, burning, tingling sensations in feet and lower legs (to knees), sensory loss in his left hand and arm, numbness in the left lower face, and also progressive balance impairment.  At this time, he is unable to walk  independently and requires at least a cane for ambulation.  He was recently diagnosed with small cell cancer and plans to start on carbo+etoposide soon with Dr. Alen Blew.  Medications: Current Outpatient Medications on File Prior to Visit  Medication Sig Dispense Refill  . ACCU-CHEK AVIVA PLUS test strip USE 2 (TWO) TIMES DAILY AS DIRECTED FOR HYPERGLYCEMIA  21  . aspirin 81 MG chewable tablet Chew by mouth.    Marland Kitchen buPROPion (WELLBUTRIN SR) 200 MG 12 hr tablet Take 1 tablet (200 mg total) by mouth 2 (two) times daily. 180 tablet 1  . cholecalciferol (VITAMIN D3) 25 MCG (1000 UNIT) tablet Take 1,000 Units by mouth daily.    Marland Kitchen gabapentin (NEURONTIN) 300 MG capsule Take 1 capsule (300 mg total) by mouth 3 (three) times daily. 90 capsule 3  . lidocaine-prilocaine (EMLA) cream Apply 1 application topically as needed. 30 g 0  . montelukast (SINGULAIR) 10 MG tablet TAKE 1 TABLET BY MOUTH EVERYDAY AT BEDTIME 90 tablet 3  . oxybutynin (DITROPAN-XL) 5 MG 24 hr tablet TAKE 1 TABLET BY MOUTH EVERYDAY AT BEDTIME 90 tablet 3  . pantoprazole (PROTONIX) 40 MG tablet Take 1 tablet (40 mg total) by mouth daily. 90 tablet 1  . prochlorperazine (COMPAZINE) 10 MG tablet Take 1 tablet (10 mg total) by mouth every 6 (six) hours as needed for nausea or vomiting. 30 tablet 0  . rosuvastatin (CRESTOR) 20 MG tablet TAKE 1 TABLET BY MOUTH EVERY DAY 90 tablet 3  . Vitamins-Lipotropics (VITA-PLUS G) CAPS Take by mouth.     No current facility-administered medications on file prior to visit.  Allergies:  Allergies  Allergen Reactions  . Tape Other (See Comments)    skin was red under EKG electrodes   Past Medical History:  Past Medical History:  Diagnosis Date  . Anxiety   . Colon polyps   . Depression   . Diabetes mellitus without complication ([facility])   . GERD (gastroesophageal reflux disease)   . Hyperlipidemia   . Hypertension   . prostate ca dx'd 01/2016   prostate cancer-prostatectomy in 2017  . Sleep apnea     no cpap at present  . Urine incontinence    Past Surgical History:  Past Surgical History:  Procedure Laterality Date  . APPENDECTOMY  2012  . COLONOSCOPY    . HEMICOLECTOMY Right 2012  . IR IMAGING GUIDED PORT INSERTION  12/09/2020  . PROSTATECTOMY  2017   Social History:  Social History   Socioeconomic History  . Marital status: Widowed    Spouse name: Not on file  . Number of children: Not on file  . Years of education: Not on file  . Highest education level: Not on file  Occupational History  . Not on file  Tobacco Use  . Smoking status: Former Research scientist (life sciences)  . Smokeless tobacco: Never Used  . Tobacco comment: quit 2015  Vaping Use  . Vaping Use: Never used  Substance and Sexual Activity  . Alcohol use: Yes    Alcohol/week: 6.0 standard drinks    Types: 6 Cans of beer per week  . Drug use: Never  . Sexual activity: Not on file  Other Topics Concern  . Not on file  Social History Narrative  . Not on file   Social Determinants of Health   Financial Resource Strain: Not on file  Food Insecurity: Not on file  Transportation Needs: Not on file  Physical Activity: Not on file  Stress: Not on file  Social Connections: Not on file  Intimate Partner Violence: Not on file   Family History:  Family History  Problem Relation Age of Onset  . Heart attack Mother   . Heart disease Mother   . Miscarriages / [place] Mother   . Depression Father   . Hyperlipidemia Father   . Hypertension Father   . Alcohol abuse Sister   . Cancer Brother   . Depression Brother   . Early death Brother   . Hearing loss Brother   . Heart attack Brother   . Heart disease Brother   . Hyperlipidemia Brother   . Hypertension Brother   . Depression Daughter   . Depression Son   . Heart disease Maternal Grandmother   . Hypertension Maternal Grandmother   . Cancer Maternal Grandfather   . Depression Paternal Grandmother   . Heart attack Paternal Grandfather   . Heart disease Paternal  Grandfather   . Diabetes Brother   . Hypertension Brother   . Pancreatic cancer Brother   . Depression Daughter   . Depression Daughter   . Depression Son   . Colon cancer Neg Hx   . Colon polyps Neg Hx   . Esophageal cancer Neg Hx   . Stomach cancer Neg Hx   . Rectal cancer Neg Hx     Review of Systems: Constitutional: Doesn't report fevers, chills or abnormal weight loss Eyes: Doesn't report blurriness of vision Ears, nose, mouth, throat, and face: Doesn't report sore throat Respiratory: Doesn't report cough, dyspnea or wheezes Cardiovascular: Doesn't report palpitation, chest discomfort  Gastrointestinal:  Doesn't report nausea, constipation, diarrhea GU: Doesn't  report incontinence Skin: Doesn't report skin rashes Neurological: Per HPI Musculoskeletal: Doesn't report joint pain Behavioral/Psych: Doesn't report anxiety  Physical Exam: Vitals:   12/17/20 1041  BP: 110/78  Pulse: 99  Resp: 18  Temp: 97.6 F (36.4 C)  SpO2: 100%   KPS: 60. General: Alert, cooperative, pleasant, in no acute distress Head: Normal EENT: No conjunctival injection or scleral icterus.  Lungs: Resp effort normal Cardiac: Regular rate Abdomen: Non-distended abdomen Skin: No rashes cyanosis or petechiae. Extremities: No clubbing or edema  Neurologic Exam: Mental Status: Awake, alert, attentive to examiner. Oriented to self and environment. Language is fluent with intact comprehension.  Cranial Nerves: Visual acuity is grossly normal. Visual fields are full. Extra-ocular movements intact. No ptosis. Face is symmetric.  Sensory impairment left V2+V3. Motor: Tone and bulk are normal. Power is full in both arms and legs, but noted fine motor impairment in right hand. Reflexes are absent or trace diffusely, no pathologic reflexes present.  Sensory: Stocking sensory loss, patchy sensory impairment on hands, arms and face Gait: Sensory dystaxia, romberg+   Labs: I have reviewed the data as  listed    Component Value Date/Time   NA 136 11/26/2020 0924   K 4.0 11/26/2020 0924   CL 98 11/26/2020 0924   CO2 28 11/26/2020 0924   GLUCOSE 139 (H) 11/26/2020 0924   BUN 21 11/26/2020 0924   CREATININE 1.34 11/26/2020 0924   CREATININE 1.11 07/28/2020 1109   CALCIUM 9.8 11/26/2020 0924   PROT 7.3 11/26/2020 0924   ALBUMIN 4.1 11/26/2020 0924   AST 19 11/26/2020 0924   ALT 24 11/26/2020 0924   ALKPHOS 69 11/26/2020 0924   BILITOT 0.5 11/26/2020 0924   GFRNONAA 69 07/28/2020 1109   GFRAA 80 07/28/2020 1109   Lab Results  Component Value Date   WBC 5.0 12/09/2020   NEUTROABS 3.9 11/26/2020   HGB 13.5 12/09/2020   HCT 39.1 12/09/2020   MCV 88.5 12/09/2020   PLT 425 (H) 12/09/2020    Imaging:  CT ABDOMEN PELVIS WO CONTRAST  Result Date: 11/17/2020 CLINICAL DATA:  Weight loss. 23 pounds in 3 weeks. History of prostate cancer. Oral contrast only as patient's creatinine is elevated. EXAM: CT ABDOMEN AND PELVIS WITHOUT CONTRAST TECHNIQUE: Multidetector CT imaging of the abdomen and pelvis was performed following the standard protocol without IV contrast. COMPARISON:  CT 12/12/2019 FINDINGS: Lower chest: New rounded subpleural nodule in the RIGHT lower lobe measures 10 mm (image 19/4). Round RIGHT lobe nodule measures 6 mm on image number 1. RIGHT middle lobe nodule measures 5 mm on same image. New nodule the lateral RIGHT lung base measuring 5 mm on image 36. Small nodules in the azygoesophageal recess of the RIGHT lower lobe. Hepatobiliary: No focal hepatic lesion on noncontrast exam. Small gallstone noted. Pancreas: Pancreas is normal. No ductal dilatation. No pancreatic inflammation. Spleen: Normal spleen Adrenals/urinary tract: Adrenal glands normal. No obstructive uropathy. Ureters and bladder normal. Stomach/Bowel: Stomach and duodenum normal. Small bowel normal. Partial RIGHT hemicolectomy. Several diverticula of the sigmoid colon without acute inflammation. Rectum normal.  Vascular/Lymphatic: Abdominal is normal caliber. There are mildly enlarged periaortic lymph nodes. For example lymph node deep to the IVC measuring 8 mm image 43/2. Lymph node deep to the aorta at same level measures 7 mm. Adenopathy in the pelvis noted which is new from CT 12/12/2019. LEFT common iliac node measures 13 mm image 56. RIGHT internal iliac node measures 16 mm image 71. RIGHT external iliac node measures 12  mm image 65. Large node in the RIGHT operator space measuring 26 mm on image 70. Reproductive: Post prostatectomy. Other: No free fluid. Musculoskeletal: Endplate sclerosis and osteophytosis in the lumbar spine. No clear aggressive osseous lesion. IMPRESSION: 1. New and enlarged pelvic lymphadenopathy and periaortic retroperitoneal adenopathy is a pattern typical of metastatic prostate cancer lymphadenopathy. 2. New bilateral lower lobe round pulmonary nodules of varying size is a pattern typical of pulmonary metastasis. 3. No skeletal metastasis identified. Electronically Signed   By: Suzy Bouchard M.D.   On: 11/17/2020 18:42   CT Chest Wo Contrast  Result Date: 12/06/2020 CLINICAL DATA:  Lung nodule on recent CT abdomen pelvis. Weight loss, prostate cancer. EXAM: CT CHEST WITHOUT CONTRAST TECHNIQUE: Multidetector CT imaging of the chest was performed following the standard protocol without IV contrast. COMPARISON:  CT abdomen pelvis 11/17/2020 and 12/12/2019. FINDINGS: Cardiovascular: Atherosclerotic calcification of the aorta, aortic valve and coronary arteries. Heart size normal. No pericardial effusion. Mediastinum/Nodes: No pathologically enlarged mediastinal or axillary lymph nodes. Hilar regions are difficult to definitively evaluate without IV contrast but appear grossly unremarkable. Esophagus is grossly unremarkable. Lungs/Pleura: Centrilobular emphysema. There are multiple bilateral pulmonary nodules which measure up to 10 mm (9 x 11 mm, 5/103) in the anterior right lower lobe.  Scarring in the left lower lobe. Those seen in the lung bases are new from 12/12/2019. No pleural fluid. Airway is unremarkable. Upper Abdomen: Visualized portions of the liver, gallbladder, adrenal glands, kidneys, spleen pancreas, stomach and bowel are grossly unremarkable. Musculoskeletal: Degenerative changes in the spine. No worrisome lytic or sclerotic lesions. IMPRESSION: 1. Multiple bilateral pulmonary nodules measure up to 10 mm. Those seen in the lung bases are new from 12/12/2019. Findings are indicative of metastatic disease. 2. Aortic atherosclerosis (ICD10-I70.0). Coronary artery calcification. 3.  Emphysema (ICD10-J43.9). Electronically Signed   By: Lorin Picket M.D.   On: 12/06/2020 16:04   MR Brain Wo Contrast  Result Date: 12/08/2020 CLINICAL DATA:  Urologic cancer, staging. EXAM: MRI HEAD WITHOUT CONTRAST TECHNIQUE: Multiplanar, multiecho pulse sequences of the brain and surrounding structures were obtained without intravenous contrast. COMPARISON:  None. FINDINGS: Brain: No acute infarction, acute hemorrhage, hydrocephalus, or extra-axial fluid collection. Focal small area of T2/FLAIR hyperintensity in the juxtacortical right frontal lobe (series 7, image 17). Mild prominence of the extra-axial spaces overlying the posterior parieto-occipital region bilaterally. Vascular: Major arterial flow voids are maintained at the skull base. Skull and upper cervical spine: Normal marrow signal. Sinuses/Orbits: Mild mucosal thickening without air-fluid levels. Unremarkable orbits. Other: No mastoid effusions. IMPRESSION: Focal small area of T2/FLAIR hyperintensity in the juxtacortical right posterior frontal lobe (series 7, image 17) is favored to reflect the sequela of chronic microvascular ischemic disease, but a small metastasis in this region is difficult to exclude without contrast or priors. A short interval follow-up MRI in approximately 6 weeks could assess stability. Electronically Signed    By: Margaretha Sheffield MD   On: 12/08/2020 18:04   CT BIOPSY  Result Date: 12/09/2020 INDICATION: 65 year old male with a history of prostate cancer and progressive pelvic and retroperitoneal lymphadenopathy concerning for metastatic disease. He presents for CT-guided biopsy of right pelvic sidewall lymphadenopathy EXAM: CT-guided core biopsy MEDICATIONS: None. ANESTHESIA/SEDATION: Moderate (conscious) sedation was employed during this procedure. A total of Versed 2 mg and Fentanyl 50 mcg was administered intravenously. Moderate Sedation Time: 12 minutes. The patient's level of consciousness and vital signs were monitored continuously by radiology nursing throughout the procedure under my direct supervision. FLUOROSCOPY TIME:  None COMPLICATIONS: None immediate. PROCEDURE: Informed written consent was obtained from the patient after a thorough discussion of the procedural risks, benefits and alternatives. All questions were addressed. Maximal Sterile Barrier Technique was utilized including caps, mask, sterile gowns, sterile gloves, sterile drape, hand hygiene and skin antiseptic. A timeout was performed prior to the initiation of the procedure. A planning axial CT scan was performed. The lymph nodes along the right pelvic sidewall were successfully identified. A suitable skin entry site was selected and marked. The overlying skin was then sterilely prepped and draped in the standard fashion utilizing chlorhexidine skin prep. Local anesthesia was attained by infiltration with 1% lidocaine. A small dermatotomy was made. Under intermittent CT guidance, a 15 cm 17 gauge introducer needle was advanced through the right iliopsoas muscle and into the pelvic sidewall lymph node. Care was taken to avoid iliac vessels. Multiple 18 gauge core biopsies were then obtained coaxially using the bio Pince automated biopsy device. Biopsy specimens were placed in saline and delivered to pathology for further analysis. Post biopsy  CT imaging demonstrates no evidence of complication. The patient tolerated the procedure well. IMPRESSION: CT-guided biopsy of right pelvic sidewall lymphadenopathy. Electronically Signed   By: Jacqulynn Cadet M.D.   On: 12/09/2020 15:05   ECHOCARDIOGRAM COMPLETE  Result Date: 12/03/2020    ECHOCARDIOGRAM REPORT   Patient Name:   Sargon Reffitt Date of Exam: 12/03/2020 Medical Rec #:  [id number]  Height:       73.0 in Accession #:    [id number] Weight:       226.8 lb Date of Birth:  11/14/1955  BSA:          2.270 m Patient Age:    45 years   BP:           134/82 mmHg Patient Gender: M          HR:           82 bpm. Exam Location:  Outpatient Procedure: 2D Echo Indications:    palpitations. Prostate Cancer.  History:        Patient has no prior history of Echocardiogram examinations.                 Abnormal ECG, Signs/Symptoms:palpitations; Risk Factors:Sleep                 Apnea, Diabetes, Dyslipidemia and Hypertension.  Sonographer:    Johny Chess RDCS Referring Phys: [id number] Elouise Munroe  Sonographer Comments: Image acquisition challenging due to respiratory motion. Global longitudinal strain was attempted. IMPRESSIONS  1. Left ventricular ejection fraction, by estimation, is 60 to 65%. The left ventricle has normal function. The left ventricle has no regional wall motion abnormalities. There is mild asymmetric left ventricular hypertrophy of the posterior-lateral segment. Left ventricular diastolic parameters are indeterminate.  2. Right ventricular systolic function is normal. The right ventricular size is normal. Tricuspid regurgitation signal is inadequate for assessing PA pressure.  3. The mitral valve is normal in structure. No evidence of mitral valve regurgitation. No evidence of mitral stenosis.  4. The aortic valve is tricuspid. Aortic valve regurgitation is not visualized. No aortic stenosis is present.  5. The inferior vena cava is normal in size with greater than 50% respiratory  variability, suggesting right atrial pressure of 3 mmHg. FINDINGS  Left Ventricle: Left ventricular ejection fraction, by estimation, is 60 to 65%. The left ventricle has normal function. The left ventricle has no regional wall motion abnormalities. Global longitudinal strain  performed but not reported based on interpreter judgement due to suboptimal tracking. The left ventricular internal cavity size was normal in size. There is mild asymmetric left ventricular hypertrophy of the posterior-lateral segment. Left ventricular diastolic parameters are indeterminate. Right Ventricle: The right ventricular size is normal. No increase in right ventricular wall thickness. Right ventricular systolic function is normal. Tricuspid regurgitation signal is inadequate for assessing PA pressure. Left Atrium: Left atrial size was normal in size. Right Atrium: Right atrial size was normal in size. Pericardium: There is no evidence of pericardial effusion. Presence of pericardial fat pad. Mitral Valve: The mitral valve is normal in structure. No evidence of mitral valve regurgitation. No evidence of mitral valve stenosis. Tricuspid Valve: The tricuspid valve is normal in structure. Tricuspid valve regurgitation is not demonstrated. No evidence of tricuspid stenosis. Aortic Valve: The aortic valve is tricuspid. Aortic valve regurgitation is not visualized. No aortic stenosis is present. Pulmonic Valve: The pulmonic valve was grossly normal. Pulmonic valve regurgitation is trivial. No evidence of pulmonic stenosis. Aorta: The aortic root is normal in size and structure. Venous: The inferior vena cava is normal in size with greater than 50% respiratory variability, suggesting right atrial pressure of 3 mmHg. IAS/Shunts: The interatrial septum appears to be lipomatous. No atrial level shunt detected by color flow Doppler.  LEFT VENTRICLE PLAX 2D LVIDd:         4.40 cm LVIDs:         2.60 cm LV PW:         1.20 cm LV IVS:        1.00 cm  LVOT diam:     2.10 cm LV SV:         63 LV SV Index:   28 LVOT Area:     3.46 cm  RIGHT VENTRICLE         IVC TAPSE (M-mode): 1.6 cm  IVC diam: 1.30 cm LEFT ATRIUM             Index       RIGHT ATRIUM           Index LA diam:        4.00 cm 1.76 cm/m  RA Area:     12.10 cm LA Vol (A2C):   47.2 ml 20.80 ml/m RA Volume:   25.20 ml  11.10 ml/m LA Vol (A4C):   37.0 ml 16.30 ml/m LA Biplane Vol: 42.7 ml 18.81 ml/m  AORTIC VALVE LVOT Vmax:   95.50 cm/s LVOT VTI:    0.181 m  AORTA Ao Root diam: 3.20 cm Ao Asc diam:  2.90 cm  SHUNTS Systemic VTI:  0.18 m Systemic Diam: 2.10 cm Cherlynn Kaiser MD Electronically signed by Cherlynn Kaiser MD Signature Date/Time: 12/03/2020/10:43:01 PM    Final    IR IMAGING GUIDED PORT INSERTION  Result Date: 12/09/2020 INDICATION: 65 year old male with prostate cancer in suspected metastatic lymphadenopathy. He presents for port catheter placement to establish durable venous access. EXAM: IMPLANTED PORT A CATH PLACEMENT WITH ULTRASOUND AND FLUOROSCOPIC GUIDANCE MEDICATIONS: None ANESTHESIA/SEDATION: Versed 1 mg IV; Fentanyl 50 mcg IV; Moderate Sedation Time:  17 minutes The patient was continuously monitored during the procedure by the interventional radiology nurse under my direct supervision. FLUOROSCOPY TIME:  0 minutes, 36 seconds (7 mGy) COMPLICATIONS: None immediate. PROCEDURE: The right neck and chest was prepped with chlorhexidine, and draped in the usual sterile fashion using maximum barrier technique (cap and mask, sterile gown, sterile gloves, large sterile sheet, hand  hygiene and cutaneous antiseptic). Local anesthesia was attained by infiltration with 1% lidocaine with epinephrine. Ultrasound demonstrated patency of the right internal jugular vein, and this was documented with an image. Under real-time ultrasound guidance, this vein was accessed with a 21 gauge micropuncture needle and image documentation was performed. A small dermatotomy was made at the access site  with an 11 scalpel. A 0.018" wire was advanced into the SVC and the access needle exchanged for a 16F micropuncture vascular sheath. The 0.018" wire was then removed and a 0.035" wire advanced into the IVC. An appropriate location for the subcutaneous reservoir was selected below the clavicle and an incision was made through the skin and underlying soft tissues. The subcutaneous tissues were then dissected using a combination of blunt and sharp surgical technique and a pocket was formed. A single lumen power injectable portacatheter was then tunneled through the subcutaneous tissues from the pocket to the dermatotomy and the port reservoir placed within the subcutaneous pocket. The venous access site was then serially dilated and a peel away vascular sheath placed over the wire. The wire was removed and the port catheter advanced into position under fluoroscopic guidance. The catheter tip is positioned in the superior cavoatrial junction. This was documented with a spot image. The portacatheter was then tested and found to flush and aspirate well. The port was flushed with saline followed by 100 units/mL heparinized saline. The pocket was then closed in two layers using first subdermal inverted interrupted absorbable sutures followed by a running subcuticular suture. The epidermis was then sealed with Dermabond. The dermatotomy at the venous access site was also closed with Dermabond. IMPRESSION: Successful placement of a right IJ approach Power Port with ultrasound and fluoroscopic guidance. The catheter is ready for use. Electronically Signed   By: Jacqulynn Cadet M.D.   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  This is suspected based on clinical  history, objective findings of prominent truncal dystaxia, impaired propioception, non-length dependent sensory loss, patchy sensory and motor involvement.  Very likely etiology is paraneoplastic, given recent (and as of yet untreated) diagnosis of small cell cancer.  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    This is an intracytoplasmic antigen, and as such is very unlikely to be amenable to aggressive immune modulation.    Workup would include extensive serum analysis, EMG, CSF analysis.  However, because of likelihood of suspected diagnosis and lack of effective therapies aside from treatment of underlying malignancy, patient has elected to defer this burdensome workup.  He understands the risk of deferring workup is incorrect diagnosis.  In addition, there is no encephalitic component, which might push [place] harder to get more detailed workup completed.      For the time being, we recommend trial of chemotherapy (carbo+etop) which could lead to improvement in symptoms.  In addition, gabapentin can be uptitrated, up to 1800mg  daily if needed for paresthesias and pain symptoms.  Case was discussed extensively with his daughter, a [facility] physician.  She and the patient are both agreeable with this plan, given desired focus on quality of life and lack of curative options available.  We will be available to both of them for follow up or further questions via phone, mychart, as needed.  We spent twenty additional minutes teaching regarding the  natural history, biology, and historical experience in the treatment of neurologic complications of cancer.   We appreciate the opportunity to participate in the care of Dreyden Rohrman.   All questions were answered. The patient knows to call the clinic with any problems, questions or concerns. No barriers to learning were detected.  The total time spent in the encounter was 40 minutes and more than 50% was on counseling and review of test  results   Ventura Sellers, MD Medical Director of Neuro-Oncology [facility] [place] 12/17/20 3:37 PM

## 2020-12-21 ENCOUNTER — Telehealth: Payer: Self-pay

## 2020-12-21 ENCOUNTER — Ambulatory Visit: Payer: Medicare Other

## 2020-12-21 NOTE — Telephone Encounter (Signed)
Spoke with patient daughter Shawna [place] and scheduled an in-person Palliative Consult for 12/31/20 @ 11AM  COVID screening was negative. No pets in home. Patient lives with 2 daughters, son in Sports coach and grandchildren.   Consent obtained; updated Outlook/Netsmart/Team List and Epic.  Family is aware they may be receiving a call from NP the day before or day of to confirm appointment.

## 2020-12-22 ENCOUNTER — Ambulatory Visit: Payer: Medicare Other

## 2020-12-22 ENCOUNTER — Other Ambulatory Visit: Payer: Self-pay | Admitting: Oncology

## 2020-12-23 ENCOUNTER — Ambulatory Visit: Payer: Medicare Other

## 2020-12-24 ENCOUNTER — Encounter: Payer: Self-pay | Admitting: Oncology

## 2020-12-24 ENCOUNTER — Encounter: Payer: Self-pay | Admitting: Family Medicine

## 2020-12-24 ENCOUNTER — Other Ambulatory Visit: Payer: Self-pay | Admitting: Family Medicine

## 2020-12-24 MED ORDER — PROMETHAZINE HCL 12.5 MG PO TABS: ORAL_TABLET | ORAL | 1 refills | Status: AC

## 2020-12-24 MED ORDER — ONDANSETRON 4 MG PO TBDP: 4.0000 mg | ORAL_TABLET | Freq: Three times a day (TID) | ORAL | 0 refills | Status: AC | PRN

## 2020-12-24 NOTE — Progress Notes (Signed)
Called pt to introduce myself as his Arboriculturist.  Pt has 2 insurances so copay assistance shouldn't be needed.  I informed him of the [employer], went over what it covers and gave him the income requirement.  Pt would like to apply so he will bring his proof of income on 12/28/20.  If approved I will give him an expense sheet and my card for any questions or concerns he may have in the future.

## 2020-12-25 ENCOUNTER — Telehealth: Payer: Self-pay | Admitting: Pulmonary Disease

## 2020-12-25 MED ORDER — ONDANSETRON 8 MG PO TBDP: 8.0000 mg | ORAL_TABLET | Freq: Three times a day (TID) | ORAL | 11 refills | Status: AC | PRN

## 2020-12-25 MED ORDER — METOCLOPRAMIDE HCL 5 MG PO TABS: 5.0000 mg | ORAL_TABLET | Freq: Three times a day (TID) | ORAL | 1 refills | Status: AC | PRN

## 2020-12-25 MED ORDER — LORAZEPAM 2 MG/ML PO CONC
2.0000 mg | Freq: Three times a day (TID) | ORAL | 0 refills | Status: AC | PRN
Start: 2020-12-25 — End: ?

## 2020-12-25 MED ORDER — NALOXONE HCL 4 MG/0.1ML NA LIQD: NASAL | 0 refills | Status: AC

## 2020-12-25 MED ORDER — MORPHINE SULFATE 20 MG/5ML PO SOLN
2.5000 mg | ORAL | 0 refills | Status: AC | PRN
Start: 2020-12-25 — End: ?

## 2020-12-25 MED ORDER — DRONABINOL 5 MG PO CAPS: 5.0000 mg | ORAL_CAPSULE | Freq: Two times a day (BID) | ORAL | 3 refills | Status: AC

## 2020-12-25 NOTE — Telephone Encounter (Signed)
PCCM:  I met with patient today. Diagnosis of metastatic small cell of prostate. In need of symptom management for palliation at home.   Orders placed for morphine solution, ativan solution, dronabinol, zofran, and reglan.   Garner Nash, DO [facility] 12/25/2020 3:44 PM

## 2020-12-26 ENCOUNTER — Other Ambulatory Visit: Payer: Self-pay | Admitting: Family Medicine

## 2020-12-27 ENCOUNTER — Ambulatory Visit: Payer: Medicare Other

## 2020-12-27 ENCOUNTER — Other Ambulatory Visit: Payer: Self-pay | Admitting: *Deleted

## 2020-12-27 DIAGNOSIS — C61 Malignant neoplasm of prostate: Secondary | ICD-10-CM

## 2020-12-27 DIAGNOSIS — C775 Secondary and unspecified malignant neoplasm of intrapelvic lymph nodes: Secondary | ICD-10-CM

## 2020-12-27 NOTE — Progress Notes (Signed)
amb  

## 2020-12-28 ENCOUNTER — Other Ambulatory Visit: Payer: Self-pay

## 2020-12-28 ENCOUNTER — Inpatient Hospital Stay: Payer: Medicare Other

## 2020-12-28 ENCOUNTER — Encounter: Payer: Self-pay | Admitting: Medical Oncology

## 2020-12-28 ENCOUNTER — Inpatient Hospital Stay: Payer: Medicare Other | Attending: Oncology

## 2020-12-28 ENCOUNTER — Encounter: Payer: Self-pay | Admitting: Oncology

## 2020-12-28 ENCOUNTER — Ambulatory Visit: Payer: Medicare Other

## 2020-12-28 VITALS — BP 115/90 | HR 100 | Temp 98.1°F | Resp 18 | Wt 204.5 lb

## 2020-12-28 DIAGNOSIS — Z5111 Encounter for antineoplastic chemotherapy: Secondary | ICD-10-CM | POA: Insufficient documentation

## 2020-12-28 DIAGNOSIS — C775 Secondary and unspecified malignant neoplasm of intrapelvic lymph nodes: Secondary | ICD-10-CM | POA: Diagnosis not present

## 2020-12-28 DIAGNOSIS — C61 Malignant neoplasm of prostate: Secondary | ICD-10-CM | POA: Insufficient documentation

## 2020-12-28 DIAGNOSIS — C801 Malignant (primary) neoplasm, unspecified: Secondary | ICD-10-CM

## 2020-12-28 LAB — CBC WITH DIFFERENTIAL (CANCER CENTER ONLY)
Abs Immature Granulocytes: 0.02 10*3/uL (ref 0.00–0.07)
Basophils Absolute: 0 10*3/uL (ref 0.0–0.1)
Basophils Relative: 1 %
Eosinophils Absolute: 0.1 10*3/uL (ref 0.0–0.5)
Eosinophils Relative: 1 %
HCT: 41.5 % (ref 39.0–52.0)
Hemoglobin: 14.4 g/dL (ref 13.0–17.0)
Immature Granulocytes: 0 %
Lymphocytes Relative: 11 %
Lymphs Abs: 0.8 10*3/uL (ref 0.7–4.0)
MCH: 29.3 pg (ref 26.0–34.0)
MCHC: 34.7 g/dL (ref 30.0–36.0)
MCV: 84.5 fL (ref 80.0–100.0)
Monocytes Absolute: 0.6 10*3/uL (ref 0.1–1.0)
Monocytes Relative: 8 %
Neutro Abs: 5.5 10*3/uL (ref 1.7–7.7)
Neutrophils Relative %: 79 %
Platelet Count: 350 10*3/uL (ref 150–400)
RBC: 4.91 MIL/uL (ref 4.22–5.81)
RDW: 11.6 % (ref 11.5–15.5)
WBC Count: 7 10*3/uL (ref 4.0–10.5)
nRBC: 0 % (ref 0.0–0.2)

## 2020-12-28 LAB — CMP (CANCER CENTER ONLY)
ALT: 23 U/L (ref 0–44)
AST: 19 U/L (ref 15–41)
Albumin: 4 g/dL (ref 3.5–5.0)
Alkaline Phosphatase: 72 U/L (ref 38–126)
Anion gap: 14 (ref 5–15)
BUN: 35 mg/dL — ABNORMAL HIGH (ref 8–23)
CO2: 26 mmol/L (ref 22–32)
Calcium: 9.1 mg/dL (ref 8.9–10.3)
Chloride: 97 mmol/L — ABNORMAL LOW (ref 98–111)
Creatinine: 1.41 mg/dL — ABNORMAL HIGH (ref 0.61–1.24)
GFR, Estimated: 55 mL/min — ABNORMAL LOW (ref >60)
Glucose, Bld: 127 mg/dL — ABNORMAL HIGH (ref 70–99)
Potassium: 3.7 mmol/L (ref 3.5–5.1)
Sodium: 137 mmol/L (ref 135–145)
Total Bilirubin: 1.1 mg/dL (ref 0.3–1.2)
Total Protein: 7.7 g/dL (ref 6.5–8.1)

## 2020-12-28 MED ORDER — SODIUM CHLORIDE 0.9 % IV SOLN
10.0000 mg | Freq: Once | INTRAVENOUS | Status: AC
  Administered 2020-12-28: 10 mg via INTRAVENOUS
  Filled 2020-12-28: qty 10

## 2020-12-28 MED ORDER — SODIUM CHLORIDE 0.9 % IV SOLN
150.0000 mg | Freq: Once | INTRAVENOUS | Status: AC
  Administered 2020-12-28: 150 mg via INTRAVENOUS
  Filled 2020-12-28: qty 150

## 2020-12-28 MED ORDER — SODIUM CHLORIDE 0.9 % IV SOLN
100.0000 mg/m2 | Freq: Once | INTRAVENOUS | Status: AC
  Administered 2020-12-28: 230 mg via INTRAVENOUS
  Filled 2020-12-28: qty 11.5

## 2020-12-28 MED ORDER — SODIUM CHLORIDE 0.9 % IV SOLN
Freq: Once | INTRAVENOUS | Status: AC
  Filled 2020-12-28: qty 250

## 2020-12-28 MED ORDER — PALONOSETRON HCL INJECTION 0.25 MG/5ML
0.2500 mg | Freq: Once | INTRAVENOUS | Status: AC
  Administered 2020-12-28: 0.25 mg via INTRAVENOUS

## 2020-12-28 MED ORDER — HEPARIN SOD (PORK) LOCK FLUSH 100 UNIT/ML IV SOLN
500.0000 [IU] | Freq: Once | INTRAVENOUS | Status: AC | PRN
  Administered 2020-12-28: 500 [IU]
  Filled 2020-12-28: qty 5

## 2020-12-28 MED ORDER — SODIUM CHLORIDE 0.9% FLUSH
10.0000 mL | INTRAVENOUS | Status: DC | PRN
  Administered 2020-12-28: 10 mL
  Filled 2020-12-28: qty 10

## 2020-12-28 MED ORDER — PALONOSETRON HCL INJECTION 0.25 MG/5ML
INTRAVENOUS | Status: AC
  Filled 2020-12-28: qty 5

## 2020-12-28 MED ORDER — SODIUM CHLORIDE 0.9 % IV SOLN
490.0000 mg | Freq: Once | INTRAVENOUS | Status: AC
  Administered 2020-12-28: 490 mg via INTRAVENOUS
  Filled 2020-12-28: qty 49

## 2020-12-28 NOTE — Patient Instructions (Addendum)
[place] Discharge Instructions for Patients Receiving Chemotherapy  Today you received the following chemotherapy agents: carboplatin, etoposide  To help prevent nausea and vomiting after your treatment, we encourage you to take your nausea medication as needed. Don't take zofran for 3 days following treatment. If you need to take medication for nausea before the 3 days, please take compazine instead.   If you develop nausea and vomiting that is not controlled by your nausea medication, call the clinic.   BELOW ARE SYMPTOMS THAT SHOULD BE REPORTED IMMEDIATELY:  *FEVER GREATER THAN 100.5 F  *CHILLS WITH OR WITHOUT FEVER  NAUSEA AND VOMITING THAT IS NOT CONTROLLED WITH YOUR NAUSEA MEDICATION  *UNUSUAL SHORTNESS OF BREATH  *UNUSUAL BRUISING OR BLEEDING  TENDERNESS IN MOUTH AND THROAT WITH OR WITHOUT PRESENCE OF ULCERS  *URINARY PROBLEMS  *BOWEL PROBLEMS  UNUSUAL RASH Items with * indicate a potential emergency and should be followed up as soon as possible.  Feel free to call the clinic should you have any questions or concerns. The clinic phone number is [phone].  Please show the [place] at check-in to the Emergency Department and triage nurse.  Carboplatin injection What is this medicine? CARBOPLATIN (KAR boe pla tin) is a chemotherapy drug. It targets fast dividing cells, like cancer cells, and causes these cells to die. This medicine is used to treat ovarian cancer and many other cancers. This medicine may be used for other purposes; ask your health care provider or pharmacist if you have questions. COMMON BRAND NAME(S): Paraplatin What should I tell my health care provider before I take this medicine? They need to know if you have any of these conditions:  blood disorders  hearing problems  kidney disease  recent or ongoing radiation therapy  an unusual or allergic reaction to carboplatin, cisplatin, other chemotherapy, other  medicines, foods, dyes, or preservatives  pregnant or trying to get pregnant  breast-feeding How should I use this medicine? This drug is usually given as an infusion into a vein. It is administered in a hospital or clinic by a specially trained health care professional. Talk to your pediatrician regarding the use of this medicine in children. Special care may be needed. Overdosage: If you think you have taken too much of this medicine contact a poison control center or emergency room at once. NOTE: This medicine is only for you. Do not share this medicine with others. What if I miss a dose? It is important not to miss a dose. Call your doctor or health care professional if you are unable to keep an appointment. What may interact with this medicine?  medicines for seizures  medicines to increase blood counts like filgrastim, pegfilgrastim, sargramostim  some antibiotics like amikacin, gentamicin, neomycin, streptomycin, tobramycin  vaccines Talk to your doctor or health care professional before taking any of these medicines:  acetaminophen  aspirin  ibuprofen  ketoprofen  naproxen This list may not describe all possible interactions. Give your health care provider a list of all the medicines, herbs, non-prescription drugs, or dietary supplements you use. Also tell them if you smoke, drink alcohol, or use illegal drugs. Some items may interact with your medicine. What should I watch for while using this medicine? Your condition will be monitored carefully while you are receiving this medicine. You will need important blood work done while you are taking this medicine. This drug may make you feel generally unwell. This is not uncommon, as chemotherapy can affect healthy cells as  well as cancer cells. Report any side effects. Continue your course of treatment even though you feel ill unless your doctor tells you to stop. In some cases, you may be given additional medicines to help  with side effects. Follow all directions for their use. Call your doctor or health care professional for advice if you get a fever, chills or sore throat, or other symptoms of a cold or flu. Do not treat yourself. This drug decreases your body's ability to fight infections. Try to avoid being around people who are sick. This medicine may increase your risk to bruise or bleed. Call your doctor or health care professional if you notice any unusual bleeding. Be careful brushing and flossing your teeth or using a toothpick because you may get an infection or bleed more easily. If you have any dental work done, tell your dentist you are receiving this medicine. Avoid taking products that contain aspirin, acetaminophen, ibuprofen, naproxen, or ketoprofen unless instructed by your doctor. These medicines may hide a fever. Do not become pregnant while taking this medicine. Women should inform their doctor if they wish to become pregnant or think they might be pregnant. There is a potential for serious side effects to an unborn child. Talk to your health care professional or pharmacist for more information. Do not breast-feed an infant while taking this medicine. What side effects may I notice from receiving this medicine? Side effects that you should report to your doctor or health care professional as soon as possible:  allergic reactions like skin rash, itching or hives, swelling of the face, lips, or tongue  signs of infection - fever or chills, cough, sore throat, pain or difficulty passing urine  signs of decreased platelets or bleeding - bruising, pinpoint red spots on the skin, black, tarry stools, nosebleeds  signs of decreased red blood cells - unusually weak or tired, fainting spells, lightheadedness  breathing problems  changes in hearing  changes in vision  chest pain  high blood pressure  low blood counts - This drug may decrease the number of white blood cells, red blood cells and  platelets. You may be at increased risk for infections and bleeding.  nausea and vomiting  pain, swelling, redness or irritation at the injection site  pain, tingling, numbness in the hands or feet  problems with balance, talking, walking  trouble passing urine or change in the amount of urine Side effects that usually do not require medical attention (report to your doctor or health care professional if they continue or are bothersome):  hair loss  loss of appetite  metallic taste in the mouth or changes in taste This list may not describe all possible side effects. Call your doctor for medical advice about side effects. You may report side effects to FDA at 1-800-FDA-1088. Where should I keep my medicine? This drug is given in a hospital or clinic and will not be stored at home. NOTE: This sheet is a summary. It may not cover all possible information. If you have questions about this medicine, talk to your doctor, pharmacist, or health care provider.  2021 Elsevier/Gold Standard (2007-12-17 14:38:05)  Etoposide, VP-16 injection What is this medicine? ETOPOSIDE, VP-16 (e toe POE side) is a chemotherapy drug. It is used to treat testicular cancer, lung cancer, and other cancers. This medicine may be used for other purposes; ask your health care provider or pharmacist if you have questions. COMMON BRAND NAME(S): Etopophos, Toposar, VePesid What should I tell my health care  provider before I take this medicine? They need to know if you have any of these conditions:  infection  kidney disease  liver disease  low blood counts, like low white cell, platelet, or red cell counts  an unusual or allergic reaction to etoposide, other medicines, foods, dyes, or preservatives  pregnant or trying to get pregnant  breast-feeding How should I use this medicine? This medicine is for infusion into a vein. It is administered in a hospital or clinic by a specially trained health care  professional. Talk to your pediatrician regarding the use of this medicine in children. Special care may be needed. Overdosage: If you think you have taken too much of this medicine contact a poison control center or emergency room at once. NOTE: This medicine is only for you. Do not share this medicine with others. What if I miss a dose? It is important not to miss your dose. Call your doctor or health care professional if you are unable to keep an appointment. What may interact with this medicine? This medicine may interact with the following medications:  warfarin This list may not describe all possible interactions. Give your health care provider a list of all the medicines, herbs, non-prescription drugs, or dietary supplements you use. Also tell them if you smoke, drink alcohol, or use illegal drugs. Some items may interact with your medicine. What should I watch for while using this medicine? Visit your doctor for checks on your progress. This drug may make you feel generally unwell. This is not uncommon, as chemotherapy can affect healthy cells as well as cancer cells. Report any side effects. Continue your course of treatment even though you feel ill unless your doctor tells you to stop. In some cases, you may be given additional medicines to help with side effects. Follow all directions for their use. Call your doctor or health care professional for advice if you get a fever, chills or sore throat, or other symptoms of a cold or flu. Do not treat yourself. This drug decreases your body's ability to fight infections. Try to avoid being around people who are sick. This medicine may increase your risk to bruise or bleed. Call your doctor or health care professional if you notice any unusual bleeding. Talk to your doctor about your risk of cancer. You may be more at risk for certain types of cancers if you take this medicine. Do not become pregnant while taking this medicine or for at least 6  months after stopping it. Women should inform their doctor if they wish to become pregnant or think they might be pregnant. Women of child-bearing potential will need to have a negative pregnancy test before starting this medicine. There is a potential for serious side effects to an unborn child. Talk to your health care professional or pharmacist for more information. Do not breast-feed an infant while taking this medicine. Men must use a latex condom during sexual contact with a woman while taking this medicine and for at least 4 months after stopping it. A latex condom is needed even if you have had a vasectomy. Contact your doctor right away if your partner becomes pregnant. Do not donate sperm while taking this medicine and for at least 4 months after you stop taking this medicine. Men should inform their doctors if they wish to father a child. This medicine may lower sperm counts. What side effects may I notice from receiving this medicine? Side effects that you should report to your doctor or  health care professional as soon as possible:  allergic reactions like skin rash, itching or hives, swelling of the face, lips, or tongue  low blood counts - this medicine may decrease the number of white blood cells, red blood cells, and platelets. You may be at increased risk for infections and bleeding  nausea, vomiting  redness, blistering, peeling or loosening of the skin, including inside the mouth  signs and symptoms of infection like fever; chills; cough; sore throat; pain or trouble passing urine  signs and symptoms of low red blood cells or anemia such as unusually weak or tired; feeling faint or lightheaded; falls; breathing problems  unusual bruising or bleeding Side effects that usually do not require medical attention (report to your doctor or health care professional if they continue or are bothersome):  changes in taste  diarrhea  hair loss  loss of appetite  mouth sores This  list may not describe all possible side effects. Call your doctor for medical advice about side effects. You may report side effects to FDA at 1-800-FDA-1088. Where should I keep my medicine? This drug is given in a hospital or clinic and will not be stored at home. NOTE: This sheet is a summary. It may not cover all possible information. If you have questions about this medicine, talk to your doctor, pharmacist, or health care provider.  2021 Elsevier/Gold Standard (2018-11-06 16:57:15)

## 2020-12-28 NOTE — Progress Notes (Signed)
Pt was also approved for the $200 Prostate grant.

## 2020-12-28 NOTE — Progress Notes (Signed)
Pt is approved for the $1000 [employer] grant.  

## 2020-12-29 ENCOUNTER — Inpatient Hospital Stay: Payer: Medicare Other

## 2020-12-29 ENCOUNTER — Other Ambulatory Visit: Payer: Self-pay

## 2020-12-29 ENCOUNTER — Ambulatory Visit: Payer: Medicare Other

## 2020-12-29 VITALS — BP 129/79 | HR 97 | Temp 97.7°F | Resp 18

## 2020-12-29 DIAGNOSIS — C801 Malignant (primary) neoplasm, unspecified: Secondary | ICD-10-CM

## 2020-12-29 DIAGNOSIS — Z5111 Encounter for antineoplastic chemotherapy: Secondary | ICD-10-CM | POA: Diagnosis not present

## 2020-12-29 MED ORDER — SODIUM CHLORIDE 0.9 % IV SOLN
Freq: Once | INTRAVENOUS | Status: AC
  Filled 2020-12-29: qty 250

## 2020-12-29 MED ORDER — SODIUM CHLORIDE 0.9% FLUSH
10.0000 mL | INTRAVENOUS | Status: DC | PRN
Start: 2020-12-29 — End: 2020-12-29
  Administered 2020-12-29: 10 mL
  Filled 2020-12-29: qty 10

## 2020-12-29 MED ORDER — SODIUM CHLORIDE 0.9 % IV SOLN
100.0000 mg/m2 | Freq: Once | INTRAVENOUS | Status: AC
  Administered 2020-12-29: 230 mg via INTRAVENOUS
  Filled 2020-12-29: qty 11.5

## 2020-12-29 MED ORDER — HEPARIN SOD (PORK) LOCK FLUSH 100 UNIT/ML IV SOLN
500.0000 [IU] | Freq: Once | INTRAVENOUS | Status: AC | PRN
  Administered 2020-12-29: 500 [IU]
  Filled 2020-12-29: qty 5

## 2020-12-29 MED ORDER — SODIUM CHLORIDE 0.9 % IV SOLN
10.0000 mg | Freq: Once | INTRAVENOUS | Status: AC
  Administered 2020-12-29: 10 mg via INTRAVENOUS
  Filled 2020-12-29: qty 10

## 2020-12-29 NOTE — Patient Instructions (Signed)
[facility] Discharge Instructions for Patients Receiving Chemotherapy  Today you received the following chemotherapy agents:  Etoposide.  To help prevent nausea and vomiting after your treatment, we encourage you to take your nausea medication as directed.   If you develop nausea and vomiting that is not controlled by your nausea medication, call the clinic.   BELOW ARE SYMPTOMS THAT SHOULD BE REPORTED IMMEDIATELY:  *FEVER GREATER THAN 100.5 F  *CHILLS WITH OR WITHOUT FEVER  NAUSEA AND VOMITING THAT IS NOT CONTROLLED WITH YOUR NAUSEA MEDICATION  *UNUSUAL SHORTNESS OF BREATH  *UNUSUAL BRUISING OR BLEEDING  TENDERNESS IN MOUTH AND THROAT WITH OR WITHOUT PRESENCE OF ULCERS  *URINARY PROBLEMS  *BOWEL PROBLEMS  UNUSUAL RASH Items with * indicate a potential emergency and should be followed up as soon as possible.  Feel free to call the clinic should you have any questions or concerns. The clinic phone number is [phone].  Please show the CHEMO ALERT CARD at check-in to the Emergency Department and triage nurse.   

## 2020-12-30 ENCOUNTER — Telehealth: Payer: Self-pay

## 2020-12-30 ENCOUNTER — Other Ambulatory Visit: Payer: Self-pay

## 2020-12-30 ENCOUNTER — Inpatient Hospital Stay: Payer: Medicare Other

## 2020-12-30 VITALS — BP 124/80 | HR 104 | Temp 97.9°F | Resp 20

## 2020-12-30 DIAGNOSIS — Z5111 Encounter for antineoplastic chemotherapy: Secondary | ICD-10-CM | POA: Diagnosis not present

## 2020-12-30 DIAGNOSIS — C801 Malignant (primary) neoplasm, unspecified: Secondary | ICD-10-CM

## 2020-12-30 MED ORDER — DEXAMETHASONE SODIUM PHOSPHATE 100 MG/10ML IJ SOLN
10.0000 mg | Freq: Once | INTRAMUSCULAR | Status: AC
  Administered 2020-12-30: 10 mg via INTRAVENOUS
  Filled 2020-12-30: qty 10

## 2020-12-30 MED ORDER — SODIUM CHLORIDE 0.9 % IV SOLN
100.0000 mg/m2 | Freq: Once | INTRAVENOUS | Status: DC
  Filled 2020-12-30: qty 11.5

## 2020-12-30 MED ORDER — FUROSEMIDE 10 MG/ML IJ SOLN
20.0000 mg | Freq: Once | INTRAMUSCULAR | Status: AC
  Administered 2020-12-30: 20 mg via INTRAVENOUS

## 2020-12-30 MED ORDER — SODIUM CHLORIDE 0.9 % IV SOLN
Freq: Once | INTRAVENOUS | Status: AC
  Filled 2020-12-30: qty 250

## 2020-12-30 MED ORDER — HEPARIN SOD (PORK) LOCK FLUSH 100 UNIT/ML IV SOLN
500.0000 [IU] | Freq: Once | INTRAVENOUS | Status: AC | PRN
  Administered 2020-12-30: 500 [IU]
  Filled 2020-12-30: qty 5

## 2020-12-30 MED ORDER — FUROSEMIDE 10 MG/ML IJ SOLN
INTRAMUSCULAR | Status: AC
  Filled 2020-12-30: qty 2

## 2020-12-30 MED ORDER — SODIUM CHLORIDE 0.9% FLUSH
10.0000 mL | INTRAVENOUS | Status: DC | PRN
  Administered 2020-12-30: 10 mL
  Filled 2020-12-30: qty 10

## 2020-12-30 NOTE — Telephone Encounter (Signed)
Zio patch returned to our office since the pt will not be able to wear due to critical illness.. per Shelly/ monitor nurse even if unused to send back to [facility] for their record.   Serial # has been assigned to him so we cannot use on anyone else. He will not be billed if it hasnt been applied and turned on. [employer] will just reprocess  *Zio placed in mail box.

## 2020-12-30 NOTE — Progress Notes (Signed)
Patient became short of breath after pre medication given. Patient's oxygen was above 90% on room air (see flowsheets.) Patient continued to be short of breath even when oxygen saturation was at 100% on room air. Patient put on 2L via n/c and increased to 3L later due to shortness of breathing continuing. Dr. Alen Blew notified and saw patient in infusion room. New orders for IV lasix 20mg s obtained and given per order.    After giving lasix patient had urine output. Patient went to bathroom without oxygen, respirations were within normal limits now, and oxygen saturation was at 100% on room air. Patient appeared to not be having as much trouble breathing and confirmed he felt a little better. Patient stated that he did not want to get treatment today. Dr. Alen Blew notified and aware. Per Dr. Alen Blew patient is okay to discharge home at this time. Patient left in a stable condition via w/c with staff to daughter who is waiting in the lobby for patient.

## 2020-12-31 ENCOUNTER — Other Ambulatory Visit: Payer: Self-pay

## 2020-12-31 ENCOUNTER — Other Ambulatory Visit: Payer: Medicare Other | Admitting: Student

## 2020-12-31 ENCOUNTER — Telehealth: Payer: Self-pay | Admitting: *Deleted

## 2020-12-31 ENCOUNTER — Inpatient Hospital Stay: Payer: Medicare Other

## 2020-12-31 VITALS — BP 112/78 | HR 102 | Temp 98.0°F | Resp 19

## 2020-12-31 DIAGNOSIS — C61 Malignant neoplasm of prostate: Secondary | ICD-10-CM

## 2020-12-31 DIAGNOSIS — Z5111 Encounter for antineoplastic chemotherapy: Secondary | ICD-10-CM | POA: Diagnosis not present

## 2020-12-31 DIAGNOSIS — Z515 Encounter for palliative care: Secondary | ICD-10-CM

## 2020-12-31 DIAGNOSIS — C775 Secondary and unspecified malignant neoplasm of intrapelvic lymph nodes: Secondary | ICD-10-CM

## 2020-12-31 DIAGNOSIS — G549 Nerve root and plexus disorder, unspecified: Secondary | ICD-10-CM

## 2020-12-31 DIAGNOSIS — F419 Anxiety disorder, unspecified: Secondary | ICD-10-CM

## 2020-12-31 DIAGNOSIS — R0600 Dyspnea, unspecified: Secondary | ICD-10-CM

## 2020-12-31 DIAGNOSIS — C801 Malignant (primary) neoplasm, unspecified: Secondary | ICD-10-CM

## 2020-12-31 MED ORDER — PEGFILGRASTIM-JMDB 6 MG/0.6ML ~~LOC~~ SOSY
PREFILLED_SYRINGE | SUBCUTANEOUS | Status: AC
  Filled 2020-12-31: qty 0.6

## 2020-12-31 MED ORDER — PEGFILGRASTIM-JMDB 6 MG/0.6ML ~~LOC~~ SOSY
6.0000 mg | PREFILLED_SYRINGE | Freq: Once | SUBCUTANEOUS | Status: AC
  Administered 2020-12-31: 6 mg via SUBCUTANEOUS

## 2020-12-31 NOTE — Patient Instructions (Signed)
Pegfilgrastim injection What is this medicine? PEGFILGRASTIM (PEG fil gra stim) is a long-acting granulocyte colony-stimulating factor that stimulates the growth of neutrophils, a type of white blood cell important in the body's fight against infection. It is used to reduce the incidence of fever and infection in patients with certain types of cancer who are receiving chemotherapy that affects the bone marrow, and to increase survival after being exposed to high doses of radiation. This medicine may be used for other purposes; ask your health care provider or pharmacist if you have questions. COMMON BRAND NAME(S): Fulphila, Neulasta, Nyvepria, UDENYCA, Ziextenzo What should I tell my health care provider before I take this medicine? They need to know if you have any of these conditions:  kidney disease  latex allergy  ongoing radiation therapy  sickle cell disease  skin reactions to acrylic adhesives (On-Body Injector only)  an unusual or allergic reaction to pegfilgrastim, filgrastim, other medicines, foods, dyes, or preservatives  pregnant or trying to get pregnant  breast-feeding How should I use this medicine? This medicine is for injection under the skin. If you get this medicine at home, you will be taught how to prepare and give the pre-filled syringe or how to use the On-body Injector. Refer to the patient Instructions for Use for detailed instructions. Use exactly as directed. Tell your healthcare provider immediately if you suspect that the On-body Injector may not have performed as intended or if you suspect the use of the On-body Injector resulted in a missed or partial dose. It is important that you put your used needles and syringes in a special sharps container. Do not put them in a trash can. If you do not have a sharps container, call your pharmacist or healthcare provider to get one. Talk to your pediatrician regarding the use of this medicine in children. While this drug  may be prescribed for selected conditions, precautions do apply. Overdosage: If you think you have taken too much of this medicine contact a poison control center or emergency room at once. NOTE: This medicine is only for you. Do not share this medicine with others. What if I miss a dose? It is important not to miss your dose. Call your doctor or health care professional if you miss your dose. If you miss a dose due to an On-body Injector failure or leakage, a new dose should be administered as soon as possible using a single prefilled syringe for manual use. What may interact with this medicine? Interactions have not been studied. This list may not describe all possible interactions. Give your health care provider a list of all the medicines, herbs, non-prescription drugs, or dietary supplements you use. Also tell them if you smoke, drink alcohol, or use illegal drugs. Some items may interact with your medicine. What should I watch for while using this medicine? Your condition will be monitored carefully while you are receiving this medicine. You may need blood work done while you are taking this medicine. Talk to your health care provider about your risk of cancer. You may be more at risk for certain types of cancer if you take this medicine. If you are going to need a MRI, CT scan, or other procedure, tell your doctor that you are using this medicine (On-Body Injector only). What side effects may I notice from receiving this medicine? Side effects that you should report to your doctor or health care professional as soon as possible:  allergic reactions (skin rash, itching or hives, swelling of   the face, lips, or tongue)  back pain  dizziness  fever  pain, redness, or irritation at site where injected  pinpoint red spots on the skin  red or dark-brown urine  shortness of breath or breathing problems  stomach or side pain, or pain at the shoulder  swelling  tiredness  trouble  passing urine or change in the amount of urine  unusual bruising or bleeding Side effects that usually do not require medical attention (report to your doctor or health care professional if they continue or are bothersome):  bone pain  muscle pain This list may not describe all possible side effects. Call your doctor for medical advice about side effects. You may report side effects to FDA at 1-800-FDA-1088. Where should I keep my medicine? Keep out of the reach of children. If you are using this medicine at home, you will be instructed on how to store it. Throw away any unused medicine after the expiration date on the label. NOTE: This sheet is a summary. It may not cover all possible information. If you have questions about this medicine, talk to your doctor, pharmacist, or health care provider.  2021 Elsevier/Gold Standard (2019-10-03 13:20:51)  

## 2020-12-31 NOTE — Progress Notes (Signed)
Theodore Foley Consult Note Telephone: [phone]  Fax: [phone]  PATIENT NAME: Theodore Foley [address] Theodore Foley [id number] [phone] (home) [phone] (work) DOB: 1955/11/18 MRN: [id number]  PRIMARY CARE PROVIDER:    Orma Flaming, MD,  [place] [address] [phone]  REFERRING PROVIDER:   Dr. Alen Foley  RESPONSIBLE PARTY:   Extended Emergency Contact Information Primary Emergency Contact: Theodore Foley Address: [address] [place], [place] [address] [place] Phone: [phone] Work Phone: [phone] Mobile Phone: [phone] Relation: Daughter  I met face to face with patient and family in the home.  ASSESSMENT AND RECOMMENDATIONS:   Advance Care Planning: Visit at the request of Theodore Foley for palliative consult. Visit consisted of building trust and discussions on Palliative care medicine as specialized medical care for people living with serious illness, aimed at facilitating improved quality of life through symptoms relief, assisting with advance care planning and establishing goals of care. Education provided on Palliative Medicine vs. Hospice services. Palliative care will continue to provide support to patient, family and the medical team.  Patient has elected to no longer receive chemotherapy; he would like to pursue comfort path, Hospice services. Discussed with Hospice Medical Director. Dr. Valeta Harms has agreed to serve as Hospice attending. [facility] book given.   Goal of care: To remain comfortable, symptoms managed. Patient would like to transfer to [place] when nearing end of life; hospice home as an alternate.  Directives: MOST form; DNR.  Symptom Management:   Metastatic Prostate Cancer-patient no longer receiving chemotherapy. Patient being referred for admission to Hospice as he wants to pursue comfort path. Patient with  Generalized weakness-recommend w/c for locomotion, assist x 2 for transfers, hospital bed w/trapeze to aid in positioning for comfort. Monitor for falls/safety. Dyspnea-discussed starting lorazepam to help with anxiety/dyspnea; patient has lorazepam 51m every 8 hours prn ordered. Patient administered first dose of 0.525mtoday during visit to see how he tolerates. Monitor for effectiveness and education provided on side effects and increasing dosage. Education provided on administering morphine for dyspnea/pain/restlessness. Morphine 2058mml administer as directed. Start oxygen via nasal canula at 2lpm prn shortness of breath. Nausea-patient with nausea, hx of GERD. Patient encouraged to restart his Protonix 78m24mily; may mix capsule with applesauce. Continue metoclopramide prn, ondansetron prn. Pain/neuronopathy-gabapentin as directed; morphine PRN. Appetite-patient encouraged to eat and drink foods/beverages he enjoys for comfort.  Follow up Palliative Care Visit: Palliative care will continue to follow for complex decision making and symptom management. Return as needed.  Family /Caregiver/Community Supports: Palliative Medicine will provide support through admission to Hospice services.   I spent 75 minutes providing this consultation, time includes time spent with patient/family, chart review, provider coordination, and documentation. More than 50% of the time in this consultation was spent counseling and coordinating communication.   CHIEF COMPLAINT: Palliative Medicine initial consult.  History obtained from review of EMR, discussion with primary team, and  interview with family. Records reviewed and summarized below.  HISTORY OF PRESENT ILLNESS:  Theodore Foley 64 y55. year old male with multiple medical problems including prostate cancer with metastasis to intrapelvic lymph node, small cell carcinoma, sensory and motor neuronopathy, diabetes, essential hypertension, CAD, OSA, hyperlipidemia,  anxiety, depression. Palliative Care was asked to follow this patient by consultation request of Theodore Foley address advance care planning and goals of care.  This is an initial visit.  Theodore Foley presently resides at home with children and grand children; good support system. He denies pain; endorses shortness of breath at rest and exertion. He also reports some anxiety. Poor appetite; primarily taking in fluids. Tolerating popsicles, smoothie, non-carbonated beverages. Increased swallowing difficulty. Does take Zofran with relief. Daughter Theodore Foley reports a 47 pound weight loss. Loss of muscle mass also reported as patient is thinner. He is requiring assistance with adl's; generalized weakness noted. Patient reports numbness, tingling to bilateral upper extremities, right > left.    CODE STATUS: DNR  PPS: 50%  HOSPICE ELIGIBILITY/DIAGNOSIS: Metastatic Prostate Cancer  ROS   General: NAD EYES: denies vision changes ENMT: worsening swallowing difficulty Cardiovascular: denies chest pain Pulmonary: denies cough, increased SOB Abdomen: endorses poor appetite, endorses constipation GU: denies dysuria, urinary incontinence  MSK: generalized weakness, falls reported Skin: denies rashes or wounds Neurological: endorses weakness, denies pain, denies insomnia Psych: Endorses positive mood Heme/lymph/immuno: denies bruises, abnormal bleeding   Physical Exam: Pulse 88, resp 16, 150/94, sats 99% on room air Constitutional: NAD General: frail appearing EYES: anicteric sclera, lids intact, no discharge  ENMT: intact hearing, oral mucous membranes moist CV: RRR, trace LE edema Pulmonary: LCTA, dyspnea noted at rest, no cough Abdomen: BS normoactive x 4 GU: deferred MSK: generalized weakness Skin: warm and dry, no rashes or wounds on visible skin Neuro: Generalized weakness Psych: anxiety observed Hem/lymph/immuno: no widespread bruising   PAST MEDICAL HISTORY:  Past Medical History:   Diagnosis Date  . Anxiety   . Colon polyps   . Depression   . Diabetes mellitus without complication ([place])   . GERD (gastroesophageal reflux disease)   . Hyperlipidemia   . Hypertension   . prostate ca dx'd 01/2016   prostate cancer-prostatectomy in 2017  . Sleep apnea    no cpap at present  . Urine incontinence     SOCIAL HX:  Social History   Tobacco Use  . Smoking status: Former Research scientist (life sciences)  . Smokeless tobacco: Never Used  . Tobacco comment: quit 2015  Substance Use Topics  . Alcohol use: Yes    Alcohol/week: 6.0 standard drinks    Types: 6 Cans of beer per week   FAMILY HX:  Family History  Problem Relation Age of Onset  . Heart attack Mother   . Heart disease Mother   . Miscarriages / [place] Mother   . Depression Father   . Hyperlipidemia Father   . Hypertension Father   . Alcohol abuse Sister   . Cancer Brother   . Depression Brother   . Early death Brother   . Hearing loss Brother   . Heart attack Brother   . Heart disease Brother   . Hyperlipidemia Brother   . Hypertension Brother   . Depression Daughter   . Depression Son   . Heart disease Maternal Grandmother   . Hypertension Maternal Grandmother   . Cancer Maternal Grandfather   . Depression Paternal Grandmother   . Heart attack Paternal Grandfather   . Heart disease Paternal Grandfather   . Diabetes Brother   . Hypertension Brother   . Pancreatic cancer Brother   . Depression Daughter   . Depression Daughter   . Depression Son   . Colon cancer Neg Hx   . Colon polyps Neg Hx   . Esophageal cancer Neg Hx   . Stomach cancer Neg Hx   . Rectal cancer Neg Hx     ALLERGIES:  Allergies  Allergen  Reactions  . Tape Other (See Comments)    skin was red under EKG electrodes     PERTINENT MEDICATIONS:  Outpatient Encounter Medications as of 12/31/2020  Medication Sig  . ACCU-CHEK AVIVA PLUS test strip USE 2 (TWO) TIMES DAILY AS DIRECTED FOR HYPERGLYCEMIA  . aspirin 81 MG chewable tablet Chew  by mouth.  Marland Kitchen buPROPion (WELLBUTRIN SR) 200 MG 12 hr tablet TAKE 1 TABLET(200 MG) BY MOUTH TWICE DAILY  . cholecalciferol (VITAMIN D3) 25 MCG (1000 UNIT) tablet Take 1,000 Units by mouth daily.  Marland Kitchen dronabinol (MARINOL) 5 MG capsule Take 1 capsule (5 mg total) by mouth 2 (two) times daily before lunch and supper.  . gabapentin (NEURONTIN) 300 MG capsule Take 1 capsule (300 mg total) by mouth 3 (three) times daily.  Marland Kitchen lidocaine-prilocaine (EMLA) cream Apply 1 application topically as needed.  Marland Kitchen LORazepam (ATIVAN) 2 MG/ML concentrated solution Take 1 mL (2 mg total) by mouth every 8 (eight) hours as needed for anxiety (Comfort care).  . metoCLOPramide (REGLAN) 5 MG tablet Take 1 tablet (5 mg total) by mouth every 8 (eight) hours as needed for nausea.  . montelukast (SINGULAIR) 10 MG tablet TAKE 1 TABLET BY MOUTH EVERYDAY AT BEDTIME  . morphine 20 MG/5ML solution Take 0.6-1.3 mLs (2.4-5.2 mg total) by mouth every 2 (two) hours as needed for pain.  . naloxone (NARCAN) nasal spray 4 mg/0.1 mL In case of suspected opiate overdose  . ondansetron (ZOFRAN ODT) 4 MG disintegrating tablet Take 1 tablet (4 mg total) by mouth every 8 (eight) hours as needed for nausea or vomiting.  . ondansetron (ZOFRAN ODT) 8 MG disintegrating tablet Take 1 tablet (8 mg total) by mouth every 8 (eight) hours as needed for nausea or vomiting.  Marland Kitchen oxybutynin (DITROPAN-XL) 5 MG 24 hr tablet TAKE 1 TABLET BY MOUTH EVERYDAY AT BEDTIME  . pantoprazole (PROTONIX) 40 MG tablet Take 1 tablet (40 mg total) by mouth daily.  . prochlorperazine (COMPAZINE) 10 MG tablet TAKE 1 TABLET(10 MG) BY MOUTH EVERY 6 HOURS AS NEEDED FOR NAUSEA OR VOMITING  . promethazine (PHENERGAN) 12.5 MG tablet Take 1-2 tablets every 8 hours as needed for nausea/vomiting.  . rosuvastatin (CRESTOR) 20 MG tablet TAKE 1 TABLET BY MOUTH EVERY DAY  . Vitamins-Lipotropics (VITA-PLUS G) CAPS Take by mouth.   No facility-administered encounter medications on file as of  12/31/2020.     Thank you for the opportunity to participate in the care of Mr. Venus. The palliative care team will continue to follow. Please call our office at [phone] if we can be of additional assistance.  Ezekiel Slocumb, NP

## 2021-01-10 ENCOUNTER — Other Ambulatory Visit: Payer: Medicare Other

## 2021-01-10 ENCOUNTER — Ambulatory Visit: Payer: Medicare Other

## 2021-01-10 ENCOUNTER — Ambulatory Visit: Payer: Medicare Other | Admitting: Oncology

## 2021-01-11 ENCOUNTER — Ambulatory Visit: Payer: Medicare Other

## 2021-01-12 ENCOUNTER — Telehealth: Payer: Self-pay | Admitting: Pulmonary Disease

## 2021-01-12 ENCOUNTER — Ambulatory Visit: Payer: Medicare Other

## 2021-01-12 NOTE — Telephone Encounter (Signed)
I have notified BI to make him aware.  Will sign off and forward message to him.

## 2021-01-14 ENCOUNTER — Telehealth: Payer: Self-pay | Admitting: Oncology

## 2021-01-14 ENCOUNTER — Ambulatory Visit: Payer: Medicare Other

## 2021-01-14 ENCOUNTER — Encounter: Payer: Self-pay | Admitting: Medical Oncology

## 2021-01-14 NOTE — Telephone Encounter (Signed)
Thanks I spoke with family. He has been settled into [facility] last night.  BLI

## 2021-01-14 NOTE — Telephone Encounter (Signed)
Cancelled appts per 4/22 sch msg. Spoke to pt's daughter who said pt was in hospice, she wanted to cancel all upcoming appts.

## 2021-01-18 ENCOUNTER — Ambulatory Visit: Payer: Medicare Other | Admitting: Oncology

## 2021-01-18 ENCOUNTER — Other Ambulatory Visit: Payer: Medicare Other

## 2021-01-18 ENCOUNTER — Ambulatory Visit: Payer: Medicare Other

## 2021-01-19 ENCOUNTER — Ambulatory Visit: Payer: Medicare Other

## 2021-01-20 ENCOUNTER — Ambulatory Visit: Payer: Medicare Other

## 2021-01-22 ENCOUNTER — Inpatient Hospital Stay: Payer: Medicare Other

## 2021-01-27 ENCOUNTER — Ambulatory Visit: Payer: Medicare Other | Admitting: Family Medicine

## 2021-01-31 ENCOUNTER — Ambulatory Visit: Payer: Medicare Other

## 2021-01-31 ENCOUNTER — Other Ambulatory Visit: Payer: Medicare Other

## 2021-01-31 ENCOUNTER — Ambulatory Visit: Payer: Medicare Other | Admitting: Oncology

## 2021-02-01 ENCOUNTER — Ambulatory Visit: Payer: Medicare Other

## 2021-02-02 ENCOUNTER — Ambulatory Visit: Payer: Medicare Other

## 2021-02-04 ENCOUNTER — Ambulatory Visit: Payer: Medicare Other

## 2021-02-23 DEATH — deceased

## 2023-01-10 IMAGING — CT CT ABD-PELV W/O CM
2 of 4 series · 16 of 46 positions shown, 18 images · non-contrast
Comparison: CT 12/12/2019

CLINICAL DATA: Weight loss. 23 pounds in 3 weeks. History of
prostate cancer. Oral contrast only as patient's creatinine is
elevated.

EXAM:
CT ABDOMEN AND PELVIS WITHOUT CONTRAST
TECHNIQUE: Multidetector CT imaging of the abdomen and pelvis was performed
following the standard protocol without IV contrast.

[Series 2: axial st · axial · 0.92mm/px · z∈[-496,-76]mm · 13 of 96 slices shown, 15 images]
[im 6/96  soft-tissue]
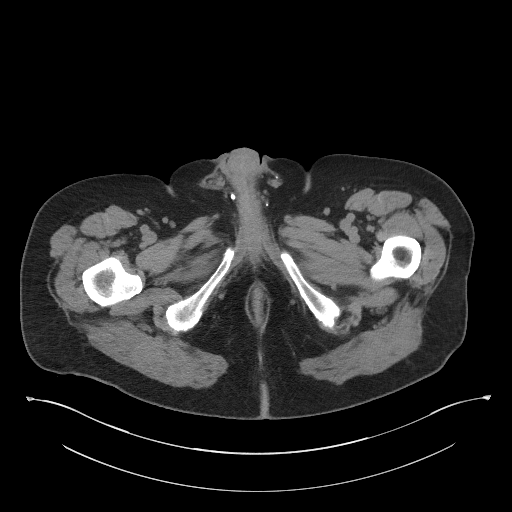
[im 6/96  bone]
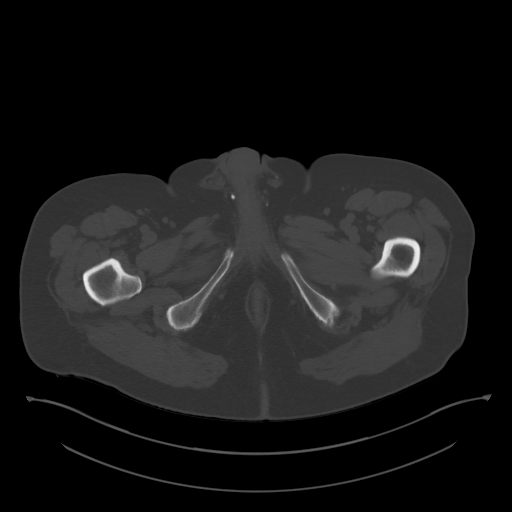
[im 12/96  soft-tissue]
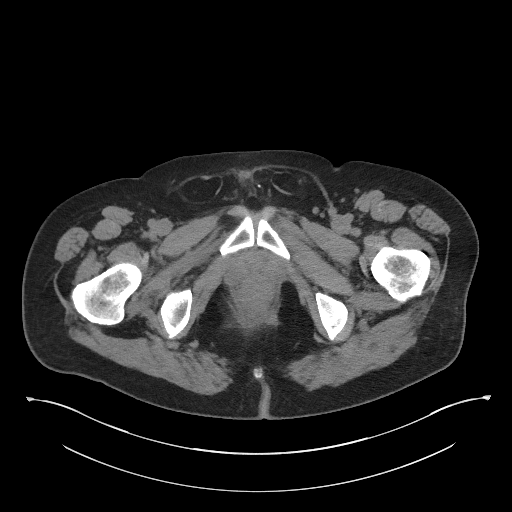
[im 18/96  soft-tissue]
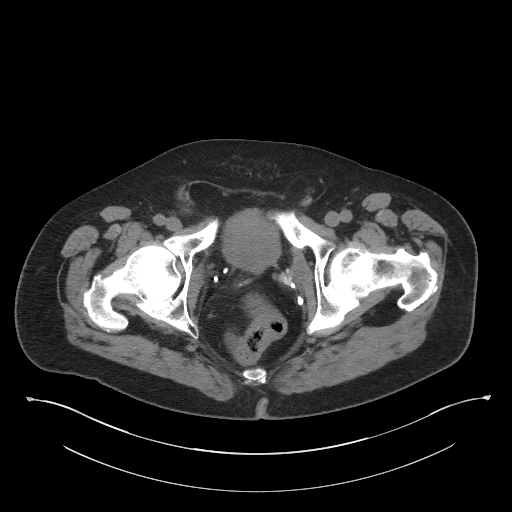
[im 30/96  soft-tissue]
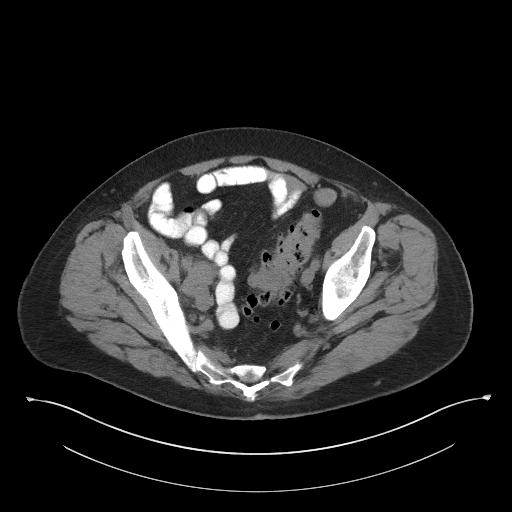
[im 36/96  soft-tissue]
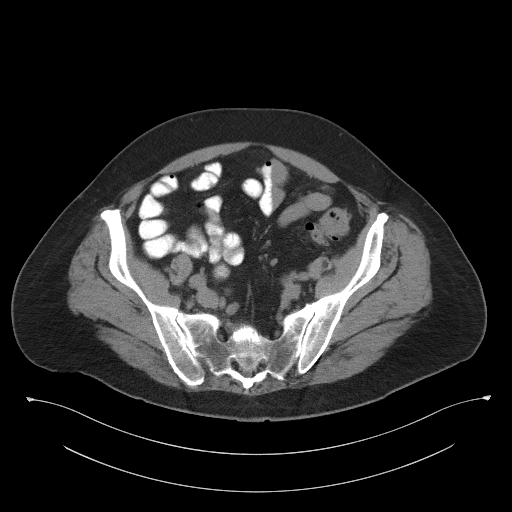
[im 42/96  soft-tissue]
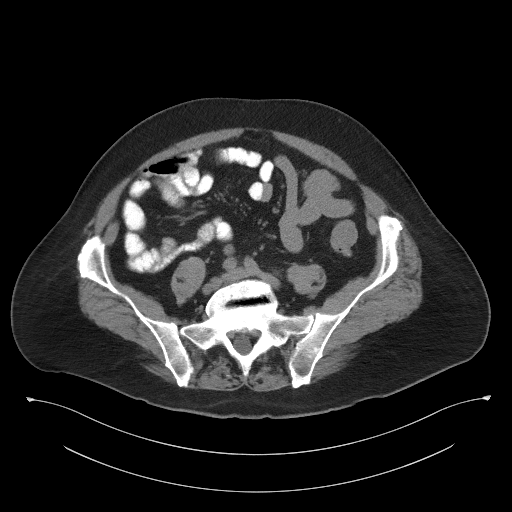
[im 48/96  soft-tissue]
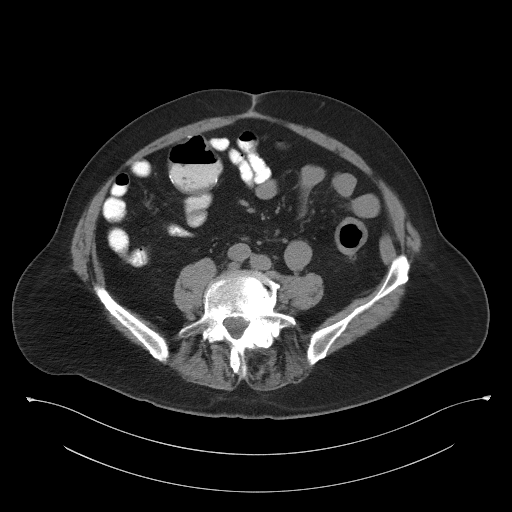
[im 54/96  soft-tissue]
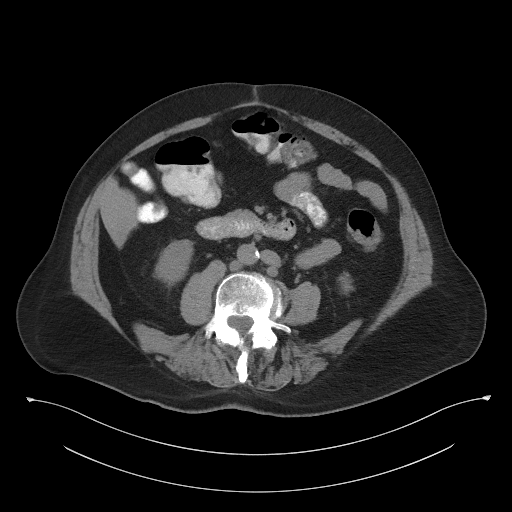
[im 60/96  soft-tissue]
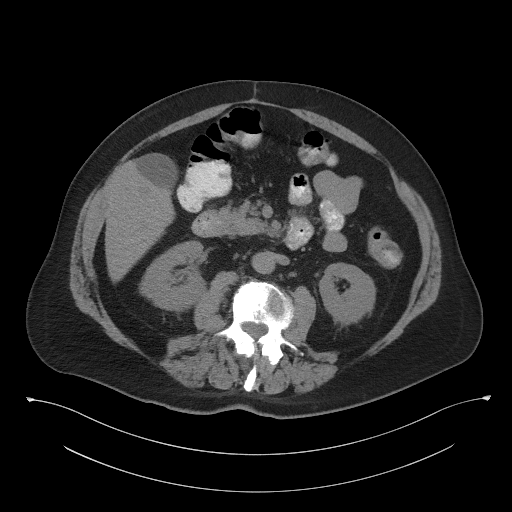
[im 60/96  bone]
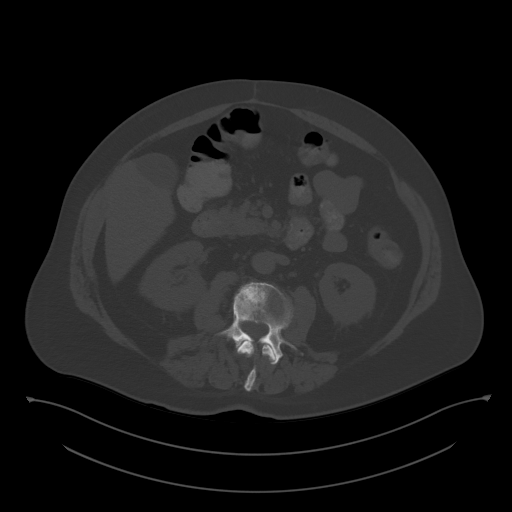
[im 66/96  soft-tissue]
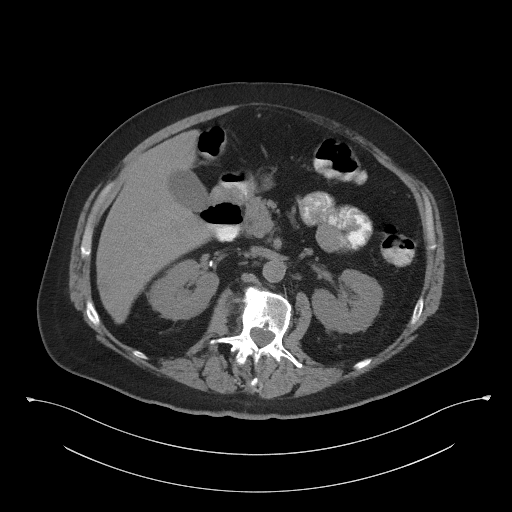
[im 78/96  soft-tissue]
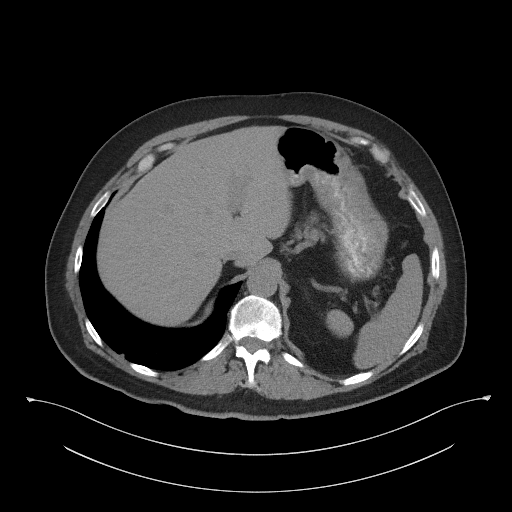
[im 84/96  soft-tissue]
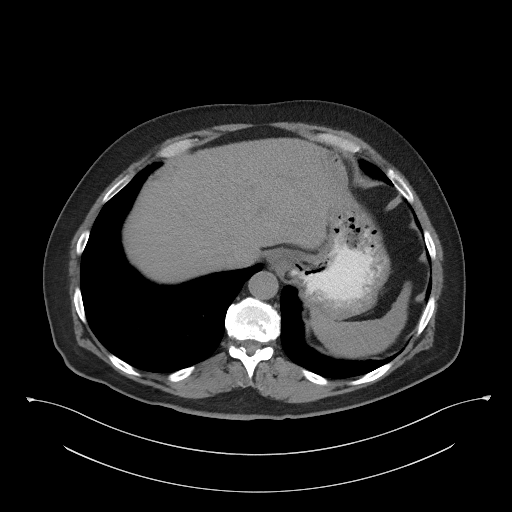
[im 90/96  soft-tissue]
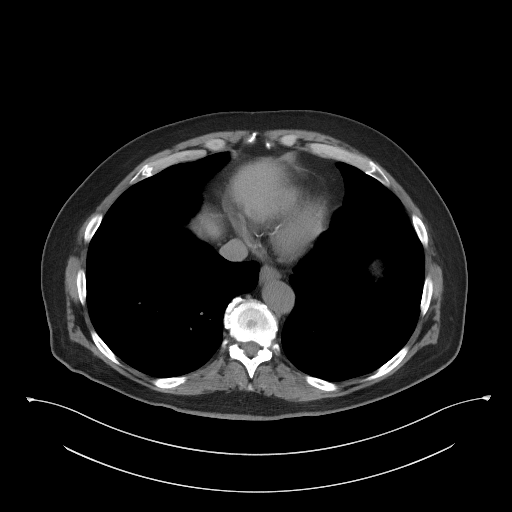

[Series 5: coronal st · coronal · 0.88mm/px · 3 of 101 slices shown]
[im 34/101  soft-tissue]
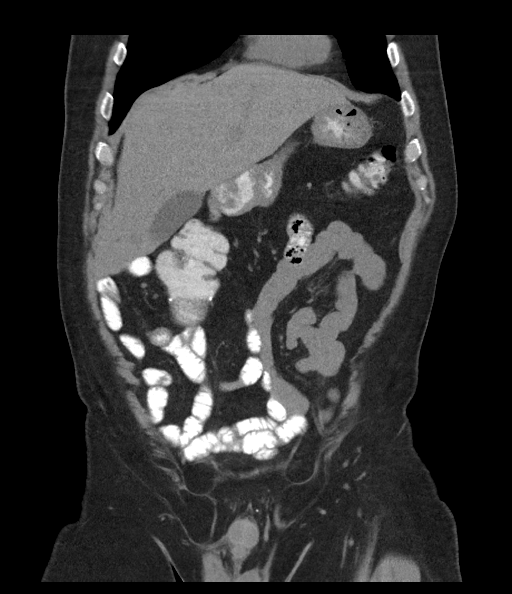
[im 45/101  soft-tissue]
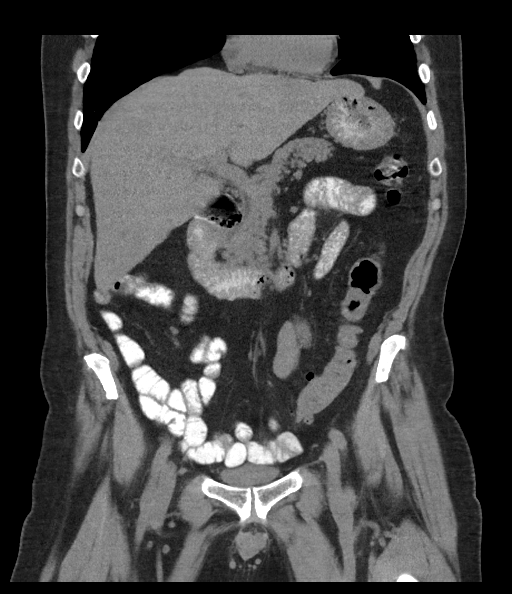
[im 56/101  soft-tissue]
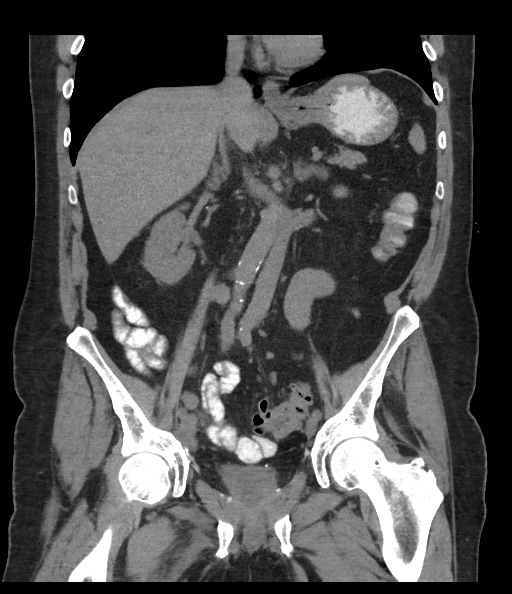

[16 of 46 positions shown; findings below may reference images not displayed]

FINDINGS: Lower chest: New rounded subpleural nodule in the RIGHT lower lobe
measures 10 mm (image [DATE]). Round RIGHT lobe nodule measures 6 mm
on image number 1. RIGHT middle lobe nodule measures 5 mm on same
image. New nodule the lateral RIGHT lung base measuring 5 mm on
image 36. Small nodules in the azygoesophageal recess of the RIGHT
lower lobe.

Hepatobiliary: No focal hepatic lesion on noncontrast exam. Small
gallstone noted.

Pancreas: Pancreas is normal. No ductal dilatation. No pancreatic
inflammation.

Spleen: Normal spleen

Adrenals/urinary tract: Adrenal glands normal. No obstructive
uropathy. Ureters and bladder normal.

Stomach/Bowel: Stomach and duodenum normal. Small bowel normal.
Partial RIGHT hemicolectomy. Several diverticula of the sigmoid
colon without acute inflammation. Rectum normal.

Vascular/Lymphatic: Abdominal is normal caliber. There are mildly
enlarged periaortic lymph nodes. For example lymph node deep to the
IVC measuring 8 mm image 43/2. Lymph node deep to the aorta at same
level measures 7 mm.

Adenopathy in the pelvis noted which is new from CT 12/12/2019.

LEFT common iliac node measures 13 mm image 56.

RIGHT internal iliac node measures 16 mm image 71.

RIGHT external iliac node measures 12 mm image 65.

Large node in the RIGHT operator space measuring 26 mm on image 70.

Reproductive: Post prostatectomy.

Other: No free fluid.

Musculoskeletal: Endplate sclerosis and osteophytosis in the lumbar
spine. No clear aggressive osseous lesion.
IMPRESSION: 1. New and enlarged pelvic lymphadenopathy and periaortic
retroperitoneal adenopathy is a pattern typical of metastatic
prostate cancer lymphadenopathy.
2. New bilateral lower lobe round pulmonary nodules of varying size
is a pattern typical of pulmonary metastasis.
3. No skeletal metastasis identified.

## 2023-08-16 NOTE — Telephone Encounter (Signed)
Telephone call
